# Patient Record
Sex: Male | Born: 1980 | Race: White | Hispanic: Yes | Marital: Married | Smoking: Current some day smoker
Health system: Southern US, Community
[De-identification: ages and names within clinical notes are randomized; demographics above are authoritative.]

## PROBLEM LIST (undated history)

## (undated) ENCOUNTER — Emergency Department (HOSPITAL_COMMUNITY): Payer: Self-pay | Source: Home / Self Care

## (undated) DIAGNOSIS — I10 Essential (primary) hypertension: Secondary | ICD-10-CM

## (undated) HISTORY — DX: Essential (primary) hypertension: I10

---

## 2004-12-15 ENCOUNTER — Ambulatory Visit (HOSPITAL_COMMUNITY): Admission: RE | Admit: 2004-12-15 | Discharge: 2004-12-15 | Payer: Self-pay | Admitting: *Deleted

## 2006-04-08 ENCOUNTER — Encounter: Admission: RE | Admit: 2006-04-08 | Discharge: 2006-04-08 | Payer: Self-pay | Admitting: Neurology

## 2011-03-03 ENCOUNTER — Ambulatory Visit: Payer: Self-pay | Admitting: Family Medicine

## 2011-03-03 ENCOUNTER — Encounter: Payer: Self-pay | Admitting: Family Medicine

## 2011-03-03 ENCOUNTER — Ambulatory Visit: Payer: Self-pay

## 2011-03-03 VITALS — BP 131/87 | HR 72 | Temp 98.1°F | Resp 16 | Ht 63.25 in | Wt 185.1 lb

## 2011-03-03 DIAGNOSIS — M25572 Pain in left ankle and joints of left foot: Secondary | ICD-10-CM

## 2011-03-03 DIAGNOSIS — M25579 Pain in unspecified ankle and joints of unspecified foot: Secondary | ICD-10-CM

## 2011-03-03 NOTE — Progress Notes (Signed)
  Subjective:    Patient ID: Dakota Wilkinson, male    DOB: 10/21/1980, 31 y.o.   MRN: [id number]  HPI 31 yo male with ankle pain for 2 months. Twisted left ankle at work, fell.  Hasn't seen anyone.  Swelled and bruised.  Able to walk on it. Still having pain, mostly just when gets cold.  Not when it is warm.  No pain at work.  Slight swelling still.  Not taking any meds.     Review of Systems Negative except as per HPI     Objective:   Physical Exam  Constitutional: He appears well-developed and well-nourished.  Pulmonary/Chest: Effort normal.  Musculoskeletal:       Left ankle: He exhibits swelling. He exhibits normal range of motion, no ecchymosis, no deformity and normal pulse. no tenderness.   Very subtle swelling around left lateral malleolus.  Otherwise normal ankle.   [facility] Primary radiology reading by Dr. Georgiana Shore: Normal.  No new or old fracture seen.      Assessment & Plan:  Ankle pain - only "when cold".  Normal exam.  ? Post injury inflammation/arthritis.  Aleve prn.  Has ankle sleeve brace at home.  RTC if worsens or changes.

## 2012-10-07 ENCOUNTER — Ambulatory Visit: Payer: Self-pay | Admitting: Family Medicine

## 2012-10-07 VITALS — BP 126/78 | HR 72 | Temp 97.6°F | Resp 16 | Ht 63.5 in | Wt 166.0 lb

## 2012-10-07 DIAGNOSIS — R209 Unspecified disturbances of skin sensation: Secondary | ICD-10-CM

## 2012-10-07 DIAGNOSIS — M791 Myalgia, unspecified site: Secondary | ICD-10-CM

## 2012-10-07 DIAGNOSIS — E119 Type 2 diabetes mellitus without complications: Secondary | ICD-10-CM

## 2012-10-07 DIAGNOSIS — R202 Paresthesia of skin: Secondary | ICD-10-CM

## 2012-10-07 DIAGNOSIS — IMO0001 Reserved for inherently not codable concepts without codable children: Secondary | ICD-10-CM

## 2012-10-07 DIAGNOSIS — R42 Dizziness and giddiness: Secondary | ICD-10-CM

## 2012-10-07 DIAGNOSIS — I10 Essential (primary) hypertension: Secondary | ICD-10-CM

## 2012-10-07 DIAGNOSIS — R079 Chest pain, unspecified: Secondary | ICD-10-CM

## 2012-10-07 LAB — POCT CBC
Granulocyte percent: 48.3 %G (ref 37–80)
Hemoglobin: 14.3 g/dL (ref 14.1–18.1)
Lymph, poc: 2.8 (ref 0.6–3.4)
MCH, POC: 31.4 pg — AB (ref 27–31.2)
MCHC: 33.1 g/dL (ref 31.8–35.4)
MCV: 95 fL (ref 80–97)
MPV: 8.4 fL (ref 0–99.8)
POC Granulocyte: 2.9 (ref 2–6.9)
POC LYMPH PERCENT: 46.2 %L (ref 10–50)
POC MID %: 5.5 %M (ref 0–12)
RBC: 4.55 M/uL — AB (ref 4.69–6.13)
RDW, POC: 12.7 %
WBC: 6 10*3/uL (ref 4.6–10.2)

## 2012-10-07 LAB — COMPREHENSIVE METABOLIC PANEL
ALT: 35 U/L (ref 0–53)
AST: 21 U/L (ref 0–37)
Albumin: 4.9 g/dL (ref 3.5–5.2)
Alkaline Phosphatase: 71 U/L (ref 39–117)
BUN: 13 mg/dL (ref 6–23)
CO2: 28 mEq/L (ref 19–32)
Calcium: 9.6 mg/dL (ref 8.4–10.5)
Chloride: 100 mEq/L (ref 96–112)
Creat: 0.8 mg/dL (ref 0.50–1.35)
Glucose, Bld: 86 mg/dL (ref 70–99)
Potassium: 4 mEq/L (ref 3.5–5.3)
Sodium: 137 mEq/L (ref 135–145)
Total Bilirubin: 0.9 mg/dL (ref 0.3–1.2)

## 2012-10-07 LAB — POCT GLYCOSYLATED HEMOGLOBIN (HGB A1C): Hemoglobin A1C: 5.2

## 2012-10-07 LAB — CK: Total CK: 174 U/L (ref 7–232)

## 2012-10-07 LAB — GLUCOSE, POCT (MANUAL RESULT ENTRY): POC Glucose: 86 mg/dl (ref 70–99)

## 2012-10-07 NOTE — Addendum Note (Signed)
Addended by: Meredith Staggers R on: 10/07/2012 11:38 AM   Modules accepted: Orders, Medications

## 2012-10-07 NOTE — Progress Notes (Addendum)
Subjective:    Patient ID: Dakota Wilkinson, male    DOB: 04/13/1980, 32 y.o.   MRN: [id number]  HPI Dakota Wilkinson is a 32 y.o. male PCP: No primary provider on file. Hx of DM2 (on Amaryl 1mg  QD), and HTN (Zestoretic 10/12.5mg  qd) with multiple areas of concern today. Last seen here in February 2013.  Has been seen at [facility] clinic 3-4 months ago - had elevated sugar at 198. Started on amaryl 1mg  qd - no eval since that time. States he had slight elevation in liver tests then, but no medicines needed.   3-4 month hx of episodic pains in arms and legs, hands, feet. Comes and goes. Occasional pins and needle feeling in fingers and toes. No abdominal pain. No rash.  Does not check home blood sugar. Denies increased thirst, drinks 4 bottles of water per day. Tx: none.   also notes occasional dizziness with itching feeling in head. 2-3 times per week. admits to occasional chest pain - few times per week, but no palpitations and not at same time as dizziness.   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   FH: DM - dad, sister, grandparents.  No known fh of cardiac dz.   Pharmacy - [facility] - [address].  Spanish speaking only. Spanish spoken. Understanding expressed.   Review of Systems  Respiratory: Negative for cough and shortness of breath.   Cardiovascular: Positive for chest pain. Negative for palpitations.  Gastrointestinal: Negative for abdominal pain.  Musculoskeletal: Positive for myalgias and arthralgias.  Skin: Negative for rash.  Neurological: Positive for dizziness, numbness (toes and fingers at times. ) and headaches. Negative for syncope, facial asymmetry and weakness.  otherwise per hpi.     Objective:   Physical Exam  Constitutional: He is oriented to person, place, and time. He appears well-developed and well-nourished.  HENT:  Head: Normocephalic and atraumatic.  Eyes: Pupils are equal, round, and reactive to light.  Cardiovascular: Normal rate,  regular rhythm, normal heart sounds and intact distal pulses.   Pulmonary/Chest: Effort normal and breath sounds normal.  Abdominal: Soft. There is no tenderness.  Musculoskeletal: He exhibits no edema and no tenderness.  Neurological: He is alert and oriented to person, place, and time. He has normal strength. He displays no atrophy and no tremor. No cranial nerve deficit or sensory deficit. He exhibits normal muscle tone. He displays a negative Romberg sign. Coordination and gait normal. GCS eye subscore is 4. GCS verbal subscore is 5. GCS motor subscore is 6.  Nonfocal, no pronator drift. Sensate to ft's, cap refill less than 1 second.   Skin: Skin is warm, dry and intact. No rash noted.  Psychiatric: He has a normal mood and affect. His behavior is normal.   EKG: SR, early repol, no acute findings otherwise.   Results for orders placed in visit on 10/07/12  POCT CBC      Result Value Range   WBC 6.0  4.6 - 10.2 K/uL   Lymph, poc 2.8  0.6 - 3.4   POC LYMPH PERCENT 46.2  10 - 50 %L   MID (cbc) 0.3  0 - 0.9   POC MID % 5.5  0 - 12 %M   POC Granulocyte 2.9  2 - 6.9   Granulocyte percent 48.3  37 - 80 %G   RBC 4.55 (*) 4.69 - 6.13 M/uL   Hemoglobin 14.3  14.1 - 18.1 g/dL   HCT, POC 40.9 (*) 81.1 - 53.7 %  MCV 95.0  80 - 97 fL   MCH, POC 31.4 (*) 27 - 31.2 pg   MCHC 33.1  31.8 - 35.4 g/dL   RDW, POC 78.2     Platelet Count, POC 251  142 - 424 K/uL   MPV 8.4  0 - 99.8 fL  GLUCOSE, POCT (MANUAL RESULT ENTRY)      Result Value Range   POC Glucose 86  70 - 99 mg/dl  POCT GLYCOSYLATED HEMOGLOBIN (HGB A1C)      Result Value Range   Hemoglobin A1C 5.2         Assessment & Plan:  Dakota Wilkinson is a 32 y.o. male Dizziness - episodic. Reassuring EKG, may be having symptomatic hypoglycemia as on Amaryl and A1c of 5.2.  Stop Amaryl, check blood sugars out of office and recheck next few weeks with readings. Increase fluids during day and when working outside. CMP and TSH pending.  RTC/ER precautions.   Myalgia - episodic, diffuse.  nonfocal exam. 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 Check CPK, tylenol if needed, recheck next few weeks.   DM II (diabetes mellitus, type II), controlled - based on single outside reading of 189, but based on A1c, possible hypoglycemia. Stop amaryl, otc glucometer, and recheck in next 2 weeks with readings.   HTN (hypertension) - Plan: Comprehensive metabolic panel - stable today.   Chest pain - Plan: CK, EKG 12-Lead, EKG 12-Lead reassuring. Episodic. If recurs - rtc or ER for further eval, but also planning on follow up in 2 weeks. msk source/myalgias as in extremities possible contributor. Er/911 CP precautions reviewed.   Paresthesias - check TSH, stop Amaryl., increase fluids during day. Rtc/er precautions. CMP and TSH pending. Plan to recheck in next 2 weeks.   Language barrier - spanish spoken, understanding expressed. Spanish speaking assistant typed instructions for pt.   Patient Instructions  Deja de tomar la medicina de la diabetes compra un medidor de Chief Financial Officer y Chief Strategy Officer por lo menos una ves al dia, apuntalos y traeme el record cuando regreses [employer]. incrementa los liquidos durante el dia especialmete cuando trabajes afuera Tylenol si necesitas para musculos adoloridos regesas a chequeo en dos semanas  si el dolor en el pecho regresa o empeora vas a cuarto de emergencia o regresas aqui.

## 2012-10-07 NOTE — Patient Instructions (Signed)
Deja de tomar la medicina de la diabetes compra un medidor de Chief Financial Officer y Chief Strategy Officer por lo menos una ves al dia, apuntalos y traeme el record cuando regreses [employer]. incrementa los liquidos durante el dia especialmete cuando trabajes afuera Tylenol si necesitas para musculos adoloridos regesas a chequeo en dos semanas  si el dolor en el pecho regresa o empeora vas a cuarto de emergencia o regresas aqui.

## 2012-11-06 ENCOUNTER — Ambulatory Visit: Payer: Self-pay | Admitting: Family Medicine

## 2012-11-06 VITALS — BP 130/80 | HR 70 | Temp 98.1°F | Resp 16 | Ht 63.5 in | Wt 169.0 lb

## 2012-11-06 DIAGNOSIS — I10 Essential (primary) hypertension: Secondary | ICD-10-CM | POA: Insufficient documentation

## 2012-11-06 DIAGNOSIS — E119 Type 2 diabetes mellitus without complications: Secondary | ICD-10-CM | POA: Insufficient documentation

## 2012-11-06 MED ORDER — LISINOPRIL-HYDROCHLOROTHIAZIDE 10-12.5 MG PO TABS: 1.0000 | ORAL_TABLET | Freq: Every day | ORAL | Status: AC

## 2012-11-06 NOTE — Progress Notes (Signed)
[facility] [address], [place] [address] [phone]  Date:  11/06/2012   Name:  Dakota Wilkinson   DOB:  1980/05/26   MRN:  [id number]  PCP:  No PCP Per Patient    Chief Complaint: Follow-up   History of Present Illness:  Dakota Wilkinson is a 32 y.o. very pleasant male patient who presents with the following:  Here today to recheck DM and HTN.  He was seen about one month ago with possible hypoglycemia.  His amaryl was stopped with plan for recheck in a few weeks. He was instructed to check his FBS every morning.  He feels fine today. He had felt dizzy when he was taking the amaryl He brings with him a list of FBG.  Running 102- 174; most are under 150.  CMP in September- looked good  He does exercise once or twice a week at the gym Work in [employer] representative, non- smoker  There are no active problems to display for this patient.   Past Medical History  Diagnosis Date  . Hypertension     History reviewed. No pertinent past surgical history.  History  Substance Use Topics  . Smoking status: Never Smoker   . Smokeless tobacco: Not on file  . Alcohol Use: Not on file    Family History  Problem Relation Age of Onset  . Diabetes Father   . Diabetes Sister     No Known Allergies  Medication list has been reviewed and updated.  Current Outpatient Prescriptions on File Prior to Visit  Medication Sig Dispense Refill  . lisinopril-hydrochlorothiazide (PRINZIDE,ZESTORETIC) 10-12.5 MG per tablet Take 1 tablet by mouth daily.       No current facility-administered medications on file prior to visit.    Review of Systems:  As per HPI- otherwise negative.   aPhysical Examination: Filed Vitals:   11/06/12 0809  BP: 130/80  Pulse: 70  Temp: 98.1 F (36.7 C)  Resp: 16   Filed Vitals:   11/06/12 0809  Height: 5' 3.5" (1.613 m)  Weight: 169 lb (76.658 kg)   Body mass index is 29.46 kg/(m^2). Ideal Body Weight: Weight in (lb) to  have BMI = 25: 143.1  GEN: WDWN, NAD, Non-toxic, A & O x 3, mild overweight/ stocky build HEENT: Atraumatic, Normocephalic. Neck supple. No masses, No LAD. Ears and Nose: No external deformity. CV: RRR, No M/G/R. No JVD. No thrill. No extra heart sounds. PULM: CTA B, no wheezes, crackles, rhonchi. No retractions. No resp. distress. No accessory muscle use. EXTR: No c/c/e NEURO Normal gait.  PSYCH: Normally interactive. Conversant. Not depressed or anxious appearing.  Calm demeanor.   Results for orders placed in visit on 11/06/12  POCT GLYCOSYLATED HEMOGLOBIN (HGB A1C)      Result Value Range   Hemoglobin A1C 5.3       Assessment and Plan: Type II or unspecified type diabetes mellitus without mention of complication, not stated as uncontrolled - Plan: POCT glycosylated hemoglobin (Hb A1C)  HTN (hypertension) - Plan: lisinopril-hydrochlorothiazide (PRINZIDE,ZESTORETIC) 10-12.5 MG per tablet  At this time his A1c looks good, we will continue to manage his DM with diet.  Encouraged more exercise (more days of the week) and discussed diet.  He will continue to check his glucose a few times a week, and plan recheck in 3 months.  Continue BP medication Encouraged flu shot at pharmacy for cost savings.  HTN is controlled  Signed Abbe Amsterdam, MD

## 2012-11-06 NOTE — Patient Instructions (Signed)
Que tiene una vacuana por el flu en la pharmacia.   seguir de Acupuncturist.   regresa a Event organiser en 3 meses

## 2012-12-03 ENCOUNTER — Ambulatory Visit: Payer: Self-pay | Admitting: Emergency Medicine

## 2012-12-03 VITALS — BP 116/68 | HR 80 | Temp 99.1°F | Resp 16 | Ht 64.0 in | Wt 174.0 lb

## 2012-12-03 DIAGNOSIS — K5900 Constipation, unspecified: Secondary | ICD-10-CM

## 2012-12-03 MED ORDER — POLYETHYLENE GLYCOL 3350 17 GM/SCOOP PO POWD: 17.0000 g | Freq: Every day | ORAL | Status: AC

## 2012-12-03 NOTE — Patient Instructions (Signed)
Constipación - Adulto   (Constipation, Adult)   Constipación significa que una persona tiene menos de 3 evacuaciones en una semana, hay dificultad para evacuar el intestino, o las heces son secas, duras, o más grandes que lo normal. A medida que envejecemos el estreñimiento es más común. Si intenta curar el estreñimiento con medicamentos que producen la evacuación intestinal (laxantes), el problema puede empeorar. El uso prolongado de laxantes puede hacer que los músculos del colon se debiliten. Una dieta baja en fibra, no tomar suficientes líquidos y el uso de ciertos medicamentos pueden empeorar el estreñimiento.   CAUSAS   · Ciertos medicamentos, como los antidepresivos, analgésicos, suplementos de hierro, antiácidos y diuréticos.    · Algunas enfermedades, como la diabetes, el síndrome del colon irritable (SII), enfermedad de la tiroides, o depresión.    · No beber suficiente agua.    · No consumir suficientes alimentos ricos en fibra.    · Situaciones de estrés o viajes.  · Falta de actividad física o de ejercicio.  · No ir al baño cuando siente la necesidad.  · Ignorar la necesidad súbita de mover el intestino.  · Uso en exceso de laxantes.  SÍNTOMAS   · Evacuar el intestino menos de 3 veces a la semana.    · Dificultad para mover el intestino    · Tener las heces secas y duras, o más grandes que las normales.    · Sensación de estar lleno o distendido.    · Dolor en la parte baja del abdomen  · No se siente alivio después de evacuar el intestino.  DIAGNÓSTICO   El médico le hará una historia clínica y le hará un examen físico. Pueden hacerle exámenes adicionales para el estreñimiento grave. Algunas pruebas son:   · Un radiografía con enema de bario para examinar el recto, el colon y en algunos casos el intestino delgado.  · Una sigmoidoscopia para examinar el colon inferior.  · Una colonoscopia para examinar todo el colon.  TRATAMIENTO   El tratamiento dependerá de la gravedad de la constipación y de la  causa. Algunos tratamientos dietéticos son beber más líquidos y comer más alimentos ricos en fibra. El cambio en el estilo de vida incluye hacer ejercicios de manera regular. Si estas recomendaciones para realizar cambios en la dieta y en el estilo de vida no ayudan, el médico le puede indicar el uso de laxantes de venta libre para favorecer el movimiento intestinal. Los medicamentos con receta se pueden prescribir si los medicamentos de venta libre no lo mejoran.   INSTRUCCIONES PARA EL CUIDADO EN EL HOGAR   · Aumente el consumo de alimentos con fibra, como frutas, verduras, granos enteros y frijoles. Limite los azúcares ricos en grasas y procesados   en su dieta, tales como papas fritas, hamburguesas, galletas, dulces y refrescos.    · Puede agregar un suplemento de fibra a su dieta si no obtiene lo suficiente de los alimentos.    · Debe ingerir gran cantidad de líquido para mantener la orina de tono claro o color amarillo pálido.    · Haga ejercicios regularmente o según las indicaciones de su médico.    · Vaya al baño cuando sienta la necesidad de ir. No espere.  · Tome sólo la medicación que le indicó el profesional.  No tome otros medicamentos para la constipación sin consultar a su médico.  SOLICITE ATENCIÓN MÉDICA DE INMEDIATO SI:   · Observa sangre brillante en las heces.    · La constipación dura más de 4 días o empeora.    · Siente   dolor abdominal o rectal.    · Las heces son delgadas como un lápiz.  · Pierde peso de manera inexplicable.  ASEGÚRESE DE QUE:   · Comprende estas instrucciones.  · Controlará su enfermedad.  · Solicitará ayuda de inmediato si no mejora o empeora.  Document Released: 01/24/2007 Document Revised: 07/06/2011  [employer]® Patient Information ©2014 [employer].

## 2012-12-03 NOTE — Progress Notes (Signed)
[facility] [address] [place] [address] [phone]  Date:  12/03/2012   Name:  Dakota Wilkinson   DOB:  [birth year]   MRN:  [id number]  PCP:  No PCP Per Patient    Chief Complaint: Follow-up   History of Present Illness:  EPIC TRIBBETT is a 32 y.o. very pleasant male patient who presents with the following:  Comes for recheck.  Concerned about pigmented areas on his lower lip and face.  Has noticed them over past month.  Says sugars are running around 120 fasting.  No polyuria or polydipsia.  Weight stable.  Describes left abdominal pain and gas over long term.  Frequently constipated.  No nausea or vomiting.  No blood  In stool.   Patient Active Problem List   Diagnosis Date Noted  . Type II or unspecified type diabetes mellitus without mention of complication, not stated as uncontrolled 11/06/2012  . HTN (hypertension) 11/06/2012    Past Medical History  Diagnosis Date  . Hypertension     No past surgical history on file.  History  Substance Use Topics  . Smoking status: Never Smoker   . Smokeless tobacco: Not on file  . Alcohol Use: Not on file    Family History  Problem Relation Age of Onset  . Diabetes Father   . Diabetes Sister     No Known Allergies  Medication list has been reviewed and updated.  Current Outpatient Prescriptions on File Prior to Visit  Medication Sig Dispense Refill  . lisinopril-hydrochlorothiazide (PRINZIDE,ZESTORETIC) 10-12.5 MG per tablet Take 1 tablet by mouth daily.  90 tablet  3   No current facility-administered medications on file prior to visit.    Review of Systems:  As per HPI, otherwise negative.    Physical Examination: Filed Vitals:   12/03/12 0813  BP: 116/68  Pulse: 80  Temp: 99.1 F (37.3 C)  Resp: 16   Filed Vitals:   12/03/12 0813  Height: 5\' 4"  (1.626 m)  Weight: 174 lb (78.926 kg)   Body mass index is 29.85 kg/(m^2). Ideal Body Weight: Weight in (lb) to have  BMI = 25: 145.3  GEN: WDWN, NAD, Non-toxic, A & O x 3 HEENT: Atraumatic, Normocephalic. Neck supple. No masses, No LAD. Ears and Nose: No external deformity. CV: RRR, No M/G/R. No JVD. No thrill. No extra heart sounds. PULM: CTA B, no wheezes, crackles, rhonchi. No retractions. No resp. distress. No accessory muscle use. ABD: S, NT, ND, +BS. No rebound. No HSM. EXTR: No c/c/e NEURO Normal gait.  PSYCH: Normally interactive. Conversant. Not depressed or anxious appearing.  Calm demeanor.  Has numerous flat hyperpigmented single color regular border several millimeter lesions on his lower lip and face.  Assessment and Plan: Constipation NIDDM diet controlled. miralax  Signed,  Phillips Odor, MD

## 2014-07-29 ENCOUNTER — Ambulatory Visit (INDEPENDENT_AMBULATORY_CARE_PROVIDER_SITE_OTHER): Payer: Self-pay | Admitting: Emergency Medicine

## 2014-07-29 VITALS — BP 144/102 | HR 86 | Temp 98.1°F | Resp 16 | Ht 64.0 in | Wt 187.8 lb

## 2014-07-29 DIAGNOSIS — IMO0002 Reserved for concepts with insufficient information to code with codable children: Secondary | ICD-10-CM

## 2014-07-29 DIAGNOSIS — R21 Rash and other nonspecific skin eruption: Secondary | ICD-10-CM

## 2014-07-29 DIAGNOSIS — E119 Type 2 diabetes mellitus without complications: Secondary | ICD-10-CM

## 2014-07-29 DIAGNOSIS — K645 Perianal venous thrombosis: Secondary | ICD-10-CM

## 2014-07-29 LAB — POCT SKIN KOH: Skin KOH, POC: POSITIVE

## 2014-07-29 LAB — POCT GLYCOSYLATED HEMOGLOBIN (HGB A1C): Hemoglobin A1C: 7.6

## 2014-07-29 LAB — GLUCOSE, POCT (MANUAL RESULT ENTRY): POC Glucose: 189 mg/dl — AB (ref 70–99)

## 2014-07-29 MED ORDER — HYDROCORTISONE 1 % EX CREA: TOPICAL_CREAM | CUTANEOUS | Status: DC

## 2014-07-29 MED ORDER — METFORMIN HCL 500 MG PO TABS: ORAL_TABLET | ORAL | Status: AC

## 2014-07-29 MED ORDER — MICONAZOLE NITRATE 2 % EX CREA: TOPICAL_CREAM | CUTANEOUS | Status: AC

## 2014-07-29 NOTE — Progress Notes (Addendum)
   Subjective:  This chart was scribed for Earl LitesSteve Lilyth Lawyer, MD by [facility] FacilityNadim Abu Hashem, medical scribe at [facility].The patient was seen in exam room 02 and the patient's care was started at 9:26 AM.   Patient ID: Everrett CoombeJulian G Skates, male    DOB: [birth year], 34 y.o.   MRN: [id number] Chief Complaint  Patient presents with  . Hemorrhoids    2-3 days  . Rash    red itchy bumps on penis, 3-4 days  HPI HPI Comments: Everrett CoombeJulian G Blodgett is a 34 y.o. male who [facility] complaining of hemorrhoids onset three days ago with associated swelling. He does have a history of hemorrhoids.  Pt also complains of a genital rash onset three days ago. The area is itchy.  He is a Insurance claims handlerspanish speaker but no translator was needed.  Review of Systems  Gastrointestinal: Positive for anal bleeding.  Skin: Positive for rash.     Objective:  BP 144/102 mmHg  Pulse 86  Temp(Src) 98.1 F (36.7 C) (Oral)  Resp 16  Ht 5\' 4"  (1.626 m)  Wt 187 lb 12.8 oz (85.186 kg)  BMI 32.22 kg/m2  SpO2 98% Physical Exam  Vitals reviewed. CONSTITUTIONAL: Well developed/well nourished HEAD: Normocephalic/atraumatic EYES: EOMI/PERRL ENMT: Mucous membranes moist NECK: supple no meningeal signs SPINE/BACK:entire spine nontender CV: S1/S2 noted, no murmurs/rubs/gallops noted LUNGS: Lungs are clear to auscultation bilaterally, no apparent distress ABDOMEN: soft, nontender, no rebound or guarding, bowel sounds noted throughout abdomen GU:no cva tenderness, uncircumcised male, skin breakdown of the foreskin with a white discharge there is mild irritation of the urethral meatus. At 3 o'clock 1.5 x 1.5 cm thrombotic hemorrhoid  NEURO: Pt is awake/alert/appropriate, moves all extremitiesx4.  No facial droop.   EXTREMITIES: pulses normal/equal, full ROM SKIN: warm, color normal PSYCH: no abnormalities of mood noted, alert and oriented to situation Results for orders placed or performed in  visit on 07/29/14  POCT glucose (manual entry)  Result Value Ref Range   POC Glucose 189 (A) 70 - 99 mg/dl  POCT Skin KOH  Result Value Ref Range   Skin KOH, POC Positive    Results for orders placed or performed in visit on 07/29/14  POCT glucose (manual entry)  Result Value Ref Range   POC Glucose 189 (A) 70 - 99 mg/dl  POCT Skin KOH  Result Value Ref Range   Skin KOH, POC Positive   POCT glycosylated hemoglobin (Hb A1C)  Result Value Ref Range   Hemoglobin A1C 7.6   PROCEDURE NOTE   the perianal area was prepped with Betadine the thrombosed hemorrhoid was numbed with 2 mL of 2% plain. A #11 blade was used to open the area. The clot was extracted without difficulty patient tolerated the procedure well Assessment & Plan:   Patient also is now diabetic. His A1c is 7.6. Will start Glucophage 1 a day for the first week and then 1 twice a day his hemorrhoid was lanced and he was started on cream for his yeast balanitis. We'll recheck in 3 months.I personally performed the services described in this documentation, which was scribed in my presence. The recorded information has been reviewed and is accurate.  Earl LitesSteve Zuriel Roskos, MD

## 2014-07-29 NOTE — Patient Instructions (Signed)
Balanitis (Balanitis) La balanitis es la inflamacin de la cabeza del pene (glande).  CAUSAS  Puede tener mltiples causas, tanto infecciosas como no infecciosas. Con frecuencia, la balanitis es el resultado de una higiene personal deficiente, especialmente en los hombres no circuncidados. Sin una higiene Betsy Layneadecuada, los virus, las bacterias y los hongos se acumulan entre el prepucio y el glande. Esto puede ocasionar una infeccin. La falta de aire y la irritacin debido a la secrecin normal llamada esmegma contribuyen al problema en los hombres no circuncidados. Otras causas son:  Irritacin qumica por el uso de algunos jabones y geles de ducha (especialmente jabones con perfume), condones, lubricantes personales, vaselina, espermicidas y acondicionadores de telas.  Enfermedades de la piel, como el eczema, la dermatitis y la psoriasis.  Alergias a algunos frmacos, como tetraciclina y sulfas.  Algunas enfermedades como la cirrosis heptica, insuficiencia cardaca congestiva y enfermedades renales.  Obesidad mrbida. FACTORES DE RIESGO  Diabetes mellitus.  Un prepucio apretado que es difcil de tirar hacia atrs hasta pasar el glande (fimosis).  Tener relaciones sexuales sin usar un condn. Blake DivineSIGNOS Y SNTOMAS  Los sntomas pueden ser:  Secrecin que proviene de la zona debajo del prepucio.  Sensibilidad.  Picazn e imposibilidad para Landscape architectmantener una ereccin (debido al dolor).  Irritacin y erupcin cutnea.  Llagas en el glande y el prepucio. DIAGNSTICO El diagnstico de balanitis se realiza a travs de un examen fsico. TRATAMIENTO El tratamiento se basa en la causa. El tratamiento puede incluir:  Lavados frecuentes.  Mantener el glande y el prepucio secos.  El uso de medicamentos, como cremas, Clinical research associateanalgsicos, antibiticos o medicamentos para tratar infecciones por hongos.  Baos de asiento. Si la irritacin est originada en una cicatriz del prepucio que impide una  retraccin fcil, se recomienda una circuncisin.  INSTRUCCIONES PARA EL CUIDADO EN EL HOGAR  Debe evitar las relaciones sexuales hasta que la afeccin se haya mejorado. ASEGRESE DE QUE:  Comprende estas instrucciones.  Controlar su afeccin.  Recibir ayuda de inmediato si no mejora o si empeora. Document Released: 09/06/2012 Document Revised: 01/09/2013 [facility] Patient Information 2015 [place], [place]. This information is not intended to replace advice given to you by your health care provider. Make sure you discuss any questions you have with your health care provider. Diabetes mellitus tipo2 (Type 2 Diabetes Mellitus) La diabetes mellitus tipo2, generalmente denominada diabetes tipo2, es una enfermedad prolongada (crnica). En la diabetes tipo2, el pncreas no produce suficiente insulina (una hormona), las clulas son menos sensibles a la insulina que se produce (resistencia a la insulina), o ambos. Normalmente, la Johnson Controlsinsulina mueve los azcares de los alimentos a las clulas de los tejidos. Las clulas de los tejidos Cendant Corporationutilizan los azcares para Psychiatristobtener energa. La falta de insulina o la falta de una respuesta normal a la insulina hace que el exceso de azcar se acumule en la sangre en lugar de Customer service managerpenetrar en las clulas de los tejidos. Como resultado, se producen niveles altos de Bankerazcar en la sangre (hiperglucemia). El [address] Veterans Drefecto de los niveles altos de azcar (glucosa) puede causar muchas complicaciones.  La diabetes tipo2 antes tambin se denominaba diabetes del Ainsworthadulto, pero puede ocurrir a Actuarycualquier edad.  FACTORES DE RIESGO  Una persona tiene mayor predisposicin a desarrollar diabetes tipo 2 si alguien en su familia tiene la enfermedad y tambin tiene uno o ms de los siguientes factores de riesgo principales:  Sobrepeso.  Estilo de vida sedentario.  Una historia de consumo constante de alimentos de altas caloras. Mantener un peso saludable  y realizar actividad fsica regular puede  reducir la probabilidad de desarrollar diabetes tipo 2. SNTOMAS  Una persona con diabetes tipo 2 no presenta sntomas en un principio. Los sntomas de la diabetes tipo 2 aparecen lentamente. Los sntomas son:  Aumento de la sed (polidipsia).  Aumento de la miccin (poliuria).  Orina con ms frecuencia durante la noche (nocturia).  Prdida de peso. La prdida de peso puede ser muy rpida.  Infecciones frecuentes y recurrentes.  Cansancio (fatiga).  Debilidad.  Cambios en la visin, como visin borrosa.  Olor a Water quality scientist.  Dolor abdominal.  Nuseas o vmitos.  Cortes o moretones que tardan en sanarse.  Hormigueo o adormecimiento de las manos y los pies. DIAGNSTICO Con frecuencia la diabetes tipo 2 no se diagnostica hasta que se presentan las complicaciones de la diabetes. La diabetes tipo 2 se diagnostica cuando los sntomas o las complicaciones se presentan y cuando aumentan los niveles de glucosa en la [place]. El nivel de glucosa en la sangre puede controlarse en uno o ms de los siguientes anlisis de sangre:  Medicin de glucosa en la sangre en [place]. No se le permitir comer durante al menos 8 horas antes de que se tome [place] de [place].  Pruebas al azar de glucosa en la sangre. El nivel de glucosa en la sangre se controla en cualquier momento del da sin importar el momento en que haya comido.  Prueba de A1c (hemoglobina glucosilada) Una prueba de A1c proporciona informacin sobre el control de la glucosa en la sangre durante los ltimos 3 meses.  Prueba de tolerancia a la glucosa oral (PTGO). La glucosa en la sangre se mide despus de no haber comido (ayunas) durante dos horas y despus de beber una bebida que contenga glucosa. TRATAMIENTO   Usted puede necesitar administrarse insulina o medicamentos para la diabetes todos los das para [employer] niveles de glucosa en la sangre en el rango deseado.  Si [place] insulina, tal vez necesite ajustar la  dosis segn los carbohidratos que haya consumido en cada comida o colacin. El objetivo del tratamiento es mantener el nivel de azcar en la sangre previo a comer (glucosa preprandial) entre 70 y /dl. INSTRUCCIONES PARA EL CUIDADO EN EL HOGAR   Controle su nivel de hemoglobina A1c dos veces al ao.  Contrlese a diario Air cabin crew de glucosa en la sangre segn las indicaciones de su mdico.  Supervise las cetonas en la orina cuando est enferma y segn las indicaciones de su mdico.  Tome el medicamento para la diabetes o adminstrese insulina segn las indicaciones de su mdico para Radio producer nivel de glucosa en la sangre en el rango deseado.  Nunca se quede sin medicamento para la diabetes o sin insulina. Es necesario que la reciba [employer].  Si [place] insulina, tal vez deba ajustar la cantidad de insulina administrada segn los carbohidratos consumidos. Los hidratos de carbono pueden aumentar los niveles de glucosa en la sangre, pero deben incluirse en su dieta. Los hidratos de carbono aportan vitaminas, minerales y [place] que son [place]omia Dear parte esencial de una dieta saludable. Los hidratos de carbono se encuentran en frutas, verduras, cereales integrales, productos lcteos, legumbres y alimentos que contienen azcares aadidos.  Consuma alimentos saludables. Programe una cita con un nutricionista certificado para que lo ayude a Chief Executive Officer de alimentacin adecuado para usted.  Baje de peso si es necesario.  Lleve una tarjeta de alerta mdica o use una pulsera o medalla de [place]  mdica.  Lleve con usted una colacin de 15gramos de hidratos de carbono en todo momento para controlar los niveles bajos de glucosa en la sangre (hipoglucemia). Algunos ejemplos de colaciones de 15gramos de hidratos de carbono son los siguientes:  Tabletas de glucosa, 3 o 4.  Gel de glucosa, tubo de 15 gramos.  Pasas de uva, 2 cucharadas (24 gramos).  Caramelos de goma, 6.  Galletas de  [place], 8.  Gaseosa comn, 4onzas ( ).  Pastillas de goma, 9.  Reconocer la hipoglucemia. La hipoglucemia se produce cuando los niveles de glucosa en la sangre son de 70 mg/dl o menos. El riesgo de hipoglucemia aumenta durante el ayuno o cuando se saltea las comidas, durante o despus de Education officer, environmental ejercicio intenso y Carbon duerme. Los sntomas de hipoglucemia son:  Temblores o sacudidas.  Disminucin de la capacidad de concentracin.  Sudoracin.  Aumento de la frecuencia cardaca.  Dolor de [place].  Sequedad en la boca.  Hambre.  Irritabilidad.  Ansiedad.  Sueo agitado.  Alteracin del habla o de la coordinacin.  Confusin.  Tratar la hipoglucemia rpidamente. Si usted est alerta y puede tragar con seguridad, siga la regla de 15/15 que consiste en:  [place] 15 y 20gramos de glucosa de accin rpida o carbohidratos. Las opciones de accin rpida son un gel de glucosa, tabletas de glucosa, o 4 onzas (120 ml) de jugo de frutas, gaseosa comn, o leche baja en grasa.  Compruebe su nivel de glucosa en la sangre 15 minutos despus de tomar la glucosa.  Tome entre 15 y 20 gramos ms de glucosa si el nivel de glucosa en la sangre todava es de 70mg /dl o inferior.  Ingiera una comida o una colacin en el lapso de 1 hora una vez que los niveles de glucosa en la sangre vuelven a la normalidad.  Est atento a si siente mucha sed u orina con mayor frecuencia, porque son signos tempranos de hiperglucemia. El reconocimiento temprano de la hiperglucemia permite un tratamiento oportuno. Trate la hiperglucemia segn le indic su mdico.  Haga, al menos, de actividad fsica moderada a la semana, distribuidos en, por lo menos, 3das a la semana o como lo indique su mdico. Adems, debe realizar ejercicios de resistencia por lo menos 2veces a la semana o como lo indique su mdico. Trate de no permanecer inactivo durante ms de  seguidos.  Ajuste su medicamento y la ingesta de alimentos, segn sea necesario, si inicia un nuevo ejercicio o deporte.  Siga su plan para los [address] de enfermedad cuando no puede comer o beber como de [place].  No consuma ningn producto que contenga tabaco, como cigarrillos, tabaco de Theatre manager o Administrator, Civil Service. Si necesita ayuda para dejar de fumar, hable con el mdico.  Limite el consumo de alcohol a no ms de 1 medida por da en las mujeres no embarazadas y 2 medidas en los hombres. Debe beber alcohol solo mientras come. Hable con su mdico acerca de si el alcohol es seguro para usted. Informe a su mdico si bebe alcohol varias veces a la semana.  Concurra a todas las visitas de control como se lo haya indicado el mdico. Esto es importante.  Programe un examen de la vista poco despus del diagnstico de diabetes tipo 2 y luego anualmente.  Realice diariamente el cuidado de la piel y de los pies. Examine su piel y los pies diariamente para ver si tiene cortes, moretones, enrojecimiento, problemas en las uas, sangrado, ampollas o Advertising account planner. Su mdico debe  hacerle un examen de los pies una vez por ao.  Cepllese los dientes y encas por lo menos dos veces al da y use hilo dental al menos una vez por da. Concurra regularmente a las visitas de control con el dentista.  Comparta su plan de control de diabetes en el trabajo o en la escuela.  Mantngase al da con las vacunas. Se recomienda que las Smith International de [id number] con diabetes se apliquen la vacuna contra la neumona. En algunos casos, pueden administrarse dos inyecciones separadas. Pregntele al mdico si tiene la vacuna contra la neumona al da.  Aprenda a Dealer.  Obtenga la mayor cantidad posible de informacin sobre la diabetes y solicite ayuda siempre que sea necesario.  Busque programas de rehabilitacin y participe en ellos para mantener o mejorar su independencia y su calidad de vida. Solicite la  derivacin a fisioterapia o terapia ocupacional si se le [employer] Health o la mano, o tiene problemas para asearse, vestirse, comer, o durante la [place] fsica. SOLICITE ATENCIN MDICA SI:   No puede comer alimentos o beber por ms de 6 horas.  Tuvo nuseas o ha vomitado durante ms de 6 horas.  Su nivel de glucosa en la sangre es mayor de 240 mg/dl.  Presenta algn cambio en el estado mental.  Desarrolla una enfermedad grave adicional.  Tuvo diarrea durante ms de 6 horas.  Ha estado enfermo o ha tenido fiebre durante un par [address] y no mejora.  Siente dolor al practicar cualquier actividad fsica. SOLICITE ATENCIN MDICA DE INMEDIATO SI:  Tiene dificultad para respirar.  Tiene niveles de cetonas moderados a altos. ASEGRESE DE QUE:  Comprende estas instrucciones.  Controlar su afeccin.  Recibir ayuda de inmediato si no mejora o si empeora. Document Released: 01/04/2005 Document Revised: 05/21/2013 [facility] Patient Information 2015 [place], [place]. This information is not intended to replace advice given to you by your health care provider. Make sure you discuss any questions you have with your health care provider.

## 2014-07-30 LAB — GC/CHLAMYDIA PROBE AMP
CT PROBE, AMP APTIMA: NEGATIVE
GC Probe RNA: NEGATIVE

## 2014-07-31 LAB — HERPES SIMPLEX VIRUS CULTURE: Organism ID, Bacteria: NOT DETECTED

## 2015-11-03 ENCOUNTER — Encounter (HOSPITAL_COMMUNITY): Payer: Self-pay | Admitting: *Deleted

## 2015-11-03 ENCOUNTER — Ambulatory Visit (INDEPENDENT_AMBULATORY_CARE_PROVIDER_SITE_OTHER): Payer: Self-pay

## 2015-11-03 ENCOUNTER — Ambulatory Visit (HOSPITAL_COMMUNITY)
Admission: EM | Admit: 2015-11-03 | Discharge: 2015-11-03 | Disposition: A | Discharge status: Home / Self Care | Payer: Self-pay | Attending: Emergency Medicine | Admitting: Emergency Medicine

## 2015-11-03 DIAGNOSIS — M79672 Pain in left foot: Secondary | ICD-10-CM

## 2015-11-03 DIAGNOSIS — M722 Plantar fascial fibromatosis: Secondary | ICD-10-CM

## 2015-11-03 MED ORDER — IBUPROFEN 800 MG PO TABS: 800.0000 mg | ORAL_TABLET | Freq: Three times a day (TID) | ORAL | 0 refills | Status: DC

## 2015-11-03 MED ORDER — TRAMADOL HCL 50 MG PO TABS: ORAL_TABLET | ORAL | 0 refills | Status: AC

## 2015-11-03 NOTE — ED Provider Notes (Signed)
HPI  SUBJECTIVE:  Dakota Wilkinson is a 35 y.o. male who presents with 2 weeks of constant, stabbing, plantar left foot pain. States it is located along the arch of his foot. It is not located anywhere else on his foot. No fevers, swelling, redness, trauma to the foot. No change in his physical activity. He works as a Corporate investment banker. No numbness, tingling, antipyretic in the past 6-8 hours. He has never had symptoms like this before. States that the first step in the morning is slightly painful. Symptoms are worse at the end of the day after he has been standing all day and with palpation. Symptoms are better with Tylenol. He has not tried anything else for this. He has a past medical history negative for diabetes, hypertension, previous history of injury to this foot. PMD: None.  History obtained using Gerilyn Nestle, CMA as Nurse, learning disability.  Past Medical History:  Diagnosis Date  . Hypertension     History reviewed. No pertinent surgical history.  Family History  Problem Relation Age of Onset  . Diabetes Father   . Diabetes Sister     Social History  Substance Use Topics  . Smoking status: Current Some Day Smoker    Years: 10.00    Types: Cigars  . Smokeless tobacco: Not on file  . Alcohol use Not on file    No current facility-administered medications for this encounter.   Current Outpatient Prescriptions:  .  hydrocortisone cream 1 %, Apply small amount to anal area twice a day, Disp: 30 g, Rfl: 0 .  ibuprofen (ADVIL,MOTRIN) 800 MG tablet, Take 1 tablet (800 mg total) by mouth 3 (three) times daily., Disp: 30 tablet, Rfl: 0 .  lisinopril-hydrochlorothiazide (PRINZIDE,ZESTORETIC) 10-12.5 MG per tablet, Take 1 tablet by mouth daily. (Patient not taking: Reported on 07/29/2014), Disp: 90 tablet, Rfl: 3 .  metFORMIN (GLUCOPHAGE) 500 MG tablet, Take 1 a day for the first week then 1 twice a day, Disp: 60 tablet, Rfl: 11 .  miconazole (MICATIN) 2 % cream, Apply small amount to the foreskin  twice a day, Disp: 28.35 g, Rfl: 0 .  polyethylene glycol powder (GLYCOLAX/MIRALAX) powder, Take 17 g by mouth daily. (Patient not taking: Reported on 07/29/2014), Disp: 3350 g, Rfl: 1 .  traMADol (ULTRAM) 50 MG tablet, 1-2 tabs po q 6 hr prn pain Maximum dose= 8 tablets per day, Disp: 20 tablet, Rfl: 0  No Known Allergies   ROS  As noted in HPI.   Physical Exam  BP 146/79 (BP Location: Left Arm)   Pulse 84   Temp 97.7 F (36.5 C) (Oral)   Resp 12   SpO2 100%   Constitutional: Well developed, well nourished, no acute distress Eyes:  EOMI, conjunctiva normal bilaterally HENT: Normocephalic, atraumatic,mucus membranes moist Respiratory: Normal inspiratory effort Cardiovascular: Normal rate GI: nondistended skin: No rash, skin intact Musculoskeletal:  Point tenderness plantar surface of the mid arch. There is no other tenderness along the plantar fascia. Normal appearance left foot. Left midfoot NT. Base of fifth metatarsal NT. No bruising. Skin intact. DP 2+. Refill less than 2 seconds. Sensation grossly intact. Patient able to move all toes actively.  Pain aggravated with toe extension.  Distal fibula NT, Medial malleolus NT,  Deltoid ligament NT, Lateral ligaments NT, Achilles NT. Patient able to bear weight while in department. Neurologic: Alert & oriented x 3, no focal neuro deficits Psychiatric: Speech and behavior appropriate   ED Course   Medications - No data to display  Orders Placed This Encounter  Procedures  . DG Foot Complete Left    Standing Status:   Standing    Number of Occurrences:   1    Order Specific Question:   Reason for Exam (SYMPTOM  OR DIAGNOSIS REQUIRED)    Answer:   r/o fx. pain plantar midfoot.  . Ambulatory referral to Podiatry    Referral Priority:   Urgent    Referral Type:   Consultation    Referral Reason:   Specialty Services Required    Requested Specialty:   Podiatry    Number of Visits Requested:   1    No results found for this  or any previous visit (from the past 24 hour(s)). Dg Foot Complete Left  Result Date: 11/03/2015 CLINICAL DATA:  Pain.  No known injury. EXAM: LEFT FOOT - COMPLETE 3+ VIEW COMPARISON:  No recent prior. FINDINGS: No acute bony or joint abnormality identified. No evidence of fracture or dislocation. IMPRESSION: No acute abnormality. Electronically Signed   By: Maisie Fushomas  Register   On: 11/03/2015 11:17    ED Clinical Impression  Plantar fasciitis of left foot  Foot pain, left   ED Assessment/Plan  Checking foot x-ray to rule out fracture given the point tenderness along the foot, otherwise if negative, we'll treat as plantar fasciitis. Home with ibuprofen, tramadol, heel cup, foot stretches, podiatry follow-up if no better in a week to 10 days. Also providing primary care referral as patient has no primary care physician.  Reviewed imaging independently. No fracture, dislocation. See radiology report for details. Linens above.  Using translator, Discussed imaging, MDM, plan and followup with patient. Answered all questions.. . Patient agrees with plan.   Meds ordered this encounter  Medications  . ibuprofen (ADVIL,MOTRIN) 800 MG tablet    Sig: Take 1 tablet (800 mg total) by mouth 3 (three) times daily.    Dispense:  30 tablet    Refill:  0  . traMADol (ULTRAM) 50 MG tablet    Sig: 1-2 tabs po q 6 hr prn pain Maximum dose= 8 tablets per day    Dispense:  20 tablet    Refill:  0    *This clinic note was created using Scientist, clinical (histocompatibility and immunogenetics)Dragon dictation software. Therefore, there may be occasional mistakes despite careful proofreading.  ?   Domenick GongAshley Latori Beggs, MD 11/03/15 1135

## 2015-11-03 NOTE — Discharge Instructions (Signed)
Heel cup, Arch support, stretching particularly at night. Follow-up with a podiatrist of your choice if no better in a week to 10 days. Follow-up with the primary care physician of your choice for routine health care. See list below  Below is a list of primary care practices who are taking new patients for you to follow-up with.   [facility] [phone] [address] [place] [address]  [facility]: [address] [place] [id number]  [phone]  [facility]: [address] [place] [id number]   [phone]  [facility]: [address] [place] [address]  [phone]  [facility] : [address] [place] [id number] [phone]  [facility]: [address] [place] [phone]  [facility]: [address] [place] [address] [phone]  Dr. Oneal GroutMahima Pandey [address] [facility] [address] [facility] [place] [id number]  [phone]  Dr. Jackie PlumGeorge Osei-Bonsu, [facility]. [address] [place] [address]  [phone]

## 2015-11-03 NOTE — ED Triage Notes (Signed)
Pt     Reports      2   Weeks      Ago        -    He  Developed  Pain  l  Foot      denys  Any specefic  Injury  He  Is  Awake  And  Alert  As  Well  As  Oriented         He  Reports  Pain on  Weight  Bearing

## 2016-02-10 ENCOUNTER — Encounter (HOSPITAL_COMMUNITY): Payer: Self-pay | Admitting: Emergency Medicine

## 2016-02-10 ENCOUNTER — Ambulatory Visit (HOSPITAL_COMMUNITY)
Admission: EM | Admit: 2016-02-10 | Discharge: 2016-02-10 | Disposition: A | Discharge status: Home / Self Care | Payer: Self-pay | Attending: Family Medicine | Admitting: Family Medicine

## 2016-02-10 DIAGNOSIS — L723 Sebaceous cyst: Secondary | ICD-10-CM

## 2016-02-10 MED ORDER — SULFAMETHOXAZOLE-TRIMETHOPRIM 800-160 MG PO TABS: 1.0000 | ORAL_TABLET | Freq: Two times a day (BID) | ORAL | 0 refills | Status: AC

## 2016-02-10 NOTE — ED Triage Notes (Signed)
Here for a painless cyst behind right ear  Denies fevers, inj/trauma, itching  Sx only include swelling  A&O x4... NAD

## 2016-02-10 NOTE — Discharge Instructions (Signed)
You have a sebaceous cyst behind your right ear. It is not large or sufficiently formed enough to be drained in clinic yet within an urgent care setting. For cosmetic purposes, a dermatologist may be able to remove it for you should you wish to follow up with one. In the mean time, I have written a prescription for Bactrim DS, take 1 tablet twice a day for 7 days, and apply warm compresses on the area for 15 minutes at a time for 4 times a day.  Should the cyst grow larger or come to a head, return to clinic for drainage.

## 2016-02-10 NOTE — ED Provider Notes (Signed)
CSN: [id number]     Arrival date & time 02/10/16  [id number] History   First MD Initiated Contact with Patient 02/10/16 1016     Chief Complaint  Patient presents with  . Cyst   (Consider location/radiation/quality/duration/timing/severity/associated sxs/prior Treatment) 36 year old male presents to clinic with a chief complaint of swelling under his right ear that has been present for 3 days. He denies pain, fever, nausea, or any systemic symptoms, he has no problem chewing or swallowing, no problems with hearing. He denies any trauma to the area.   The history is provided by the patient. The history is limited by a language barrier. A language interpreter was used.    Past Medical History:  Diagnosis Date  . Hypertension    History reviewed. No pertinent surgical history. Family History  Problem Relation Age of Onset  . Diabetes Father   . Diabetes Sister    Social History  Substance Use Topics  . Smoking status: Current Some Day Smoker    Years: 10.00    Types: Cigars  . Smokeless tobacco: Never Used  . Alcohol use No    Review of Systems  Reason unable to perform ROS: as covered in HPI.  All other systems reviewed and are negative.   Allergies  Patient has no known allergies.  Home Medications   Prior to Admission medications   Medication Sig Start Date End Date Taking? Authorizing Provider  hydrocortisone cream 1 % Apply small amount to anal area twice a day 07/29/14   Collene GobbleSteven A Daub, MD  ibuprofen (ADVIL,MOTRIN) 800 MG tablet Take 1 tablet (800 mg total) by mouth 3 (three) times daily. 11/03/15   Domenick GongAshley Mortenson, MD  lisinopril-hydrochlorothiazide (PRINZIDE,ZESTORETIC) 10-12.5 MG per tablet Take 1 tablet by mouth daily. Patient not taking: Reported on 07/29/2014 11/06/12   Pearline CablesJessica C Copland, MD  metFORMIN (GLUCOPHAGE) 500 MG tablet Take 1 a day for the first week then 1 twice a day 07/29/14   Collene GobbleSteven A Daub, MD  miconazole (MICATIN) 2 % cream Apply small amount to the  foreskin twice a day 07/29/14   Collene GobbleSteven A Daub, MD  polyethylene glycol powder (GLYCOLAX/MIRALAX) powder Take 17 g by mouth daily. Patient not taking: Reported on 07/29/2014 12/03/12   Carmelina DaneJeffery S Anderson, MD  sulfamethoxazole-trimethoprim (BACTRIM DS,SEPTRA DS) 800-160 MG tablet Take 1 tablet by mouth 2 (two) times daily. 02/10/16 02/17/16  Dorena BodoLawrence Meribeth Vitug, NP  traMADol (ULTRAM) 50 MG tablet 1-2 tabs po q 6 hr prn pain Maximum dose= 8 tablets per day 11/03/15   Domenick GongAshley Mortenson, MD   Meds Ordered and Administered this Visit  Medications - No data to display  BP 92/57 (BP Location: Right Arm)   Pulse 77   Temp 98.6 F (37 C) (Oral)   Resp 18   SpO2 96%  No data found.   Physical Exam  Constitutional: He is oriented to person, place, and time. He appears well-developed and well-nourished. No distress.  HENT:  Head: Normocephalic and atraumatic.    Right Ear: Tympanic membrane and external ear normal.  Left Ear: Tympanic membrane and external ear normal.  Nose: Nose normal.  Mouth/Throat: Oropharynx is clear and moist.  Neurological: He is alert and oriented to person, place, and time.  Skin: Skin is warm and dry. Capillary refill takes less than 2 seconds. He is not diaphoretic.  Psychiatric: He has a normal mood and affect.  Nursing note and vitals reviewed.   Urgent Care Course     Procedures (including  critical care time)  Labs Review Labs Reviewed - No data to display  Imaging Review No results found.   Visual Acuity Review  Right Eye Distance:   Left Eye Distance:   Bilateral Distance:    Right Eye Near:   Left Eye Near:    Bilateral Near:         MDM   1. Sebaceous cyst   You have a sebaceous cyst behind your right ear. It is not large or sufficiently formed enough to be drained in clinic yet within an urgent care setting. For cosmetic purposes, a dermatologist may be able to remove it for you should you wish to follow up with one. In the mean time, I  have written a prescription for Bactrim DS, take 1 tablet twice a day for 7 days, and apply warm compresses on the area for 15 minutes at a time for 4 times a day.  Should the cyst grow larger or come to a head, return to clinic for drainage.      Dorena Bodo, NP 02/10/16 1051

## 2016-05-04 ENCOUNTER — Other Ambulatory Visit: Payer: Self-pay | Admitting: Otolaryngology

## 2016-05-04 DIAGNOSIS — R221 Localized swelling, mass and lump, neck: Secondary | ICD-10-CM

## 2016-05-05 ENCOUNTER — Ambulatory Visit
Admission: RE | Admit: 2016-05-05 | Discharge: 2016-05-05 | Disposition: A | Discharge status: Home / Self Care | Payer: No Typology Code available for payment source | Source: Ambulatory Visit | Attending: Otolaryngology | Admitting: Otolaryngology

## 2016-05-05 DIAGNOSIS — R221 Localized swelling, mass and lump, neck: Secondary | ICD-10-CM

## 2016-05-05 MED ORDER — IOPAMIDOL (ISOVUE-300) INJECTION 61%
75.0000 mL | Freq: Once | INTRAVENOUS | Status: AC | PRN
Start: 2016-05-05 — End: 2016-05-05
  Administered 2016-05-05: 75 mL via INTRAVENOUS

## 2017-05-04 ENCOUNTER — Ambulatory Visit: Payer: Self-pay | Admitting: Emergency Medicine

## 2017-05-04 ENCOUNTER — Encounter: Payer: Self-pay | Admitting: Emergency Medicine

## 2017-05-04 ENCOUNTER — Other Ambulatory Visit: Payer: Self-pay

## 2017-05-04 ENCOUNTER — Ambulatory Visit (INDEPENDENT_AMBULATORY_CARE_PROVIDER_SITE_OTHER): Payer: Self-pay

## 2017-05-04 VITALS — BP 116/76 | HR 78 | Temp 98.6°F | Resp 16 | Ht 63.5 in | Wt 181.0 lb

## 2017-05-04 DIAGNOSIS — M25522 Pain in left elbow: Secondary | ICD-10-CM

## 2017-05-04 DIAGNOSIS — S59902A Unspecified injury of left elbow, initial encounter: Secondary | ICD-10-CM | POA: Insufficient documentation

## 2017-05-04 NOTE — Progress Notes (Signed)
Dakota CoombeJulian G Wilkinson 37 y.o.   Chief Complaint  Patient presents with  . Elbow Injury    per patient hit LEFT elbow and 5-6 months ago now a knot     HISTORY OF PRESENT ILLNESS: [name] complaining of left elbow bump since injury 5- 6 months ago.  Slightly tender.  HPI   Prior to Admission medications   Medication Sig Start Date End Date Taking? Authorizing Provider  hydrocortisone cream 1 % Apply small amount to anal area twice a day Patient not taking: Reported on 05/04/2017 07/29/14   Collene Gobbleaub, Steven A, MD  ibuprofen (ADVIL,MOTRIN) 800 MG tablet Take 1 tablet (800 mg total) by mouth 3 (three) times daily. Patient not taking: Reported on 05/04/2017 11/03/15   Domenick GongMortenson, Ashley, MD  lisinopril-hydrochlorothiazide (PRINZIDE,ZESTORETIC) 10-12.5 MG per tablet Take 1 tablet by mouth daily. Patient not taking: Reported on 07/29/2014 11/06/12   Copland, Gwenlyn FoundJessica C, MD  metFORMIN (GLUCOPHAGE) 500 MG tablet Take 1 a day for the first week then 1 twice a day Patient not taking: Reported on 05/04/2017 07/29/14   Collene Gobbleaub, Steven A, MD  miconazole (MICATIN) 2 % cream Apply small amount to the foreskin twice a day Patient not taking: Reported on 05/04/2017 07/29/14   Collene Gobbleaub, Steven A, MD  polyethylene glycol powder (GLYCOLAX/MIRALAX) powder Take 17 g by mouth daily. Patient not taking: Reported on 07/29/2014 12/03/12   Carmelina DaneAnderson, Jeffery S, MD  traMADol Janean Sark(ULTRAM) 50 MG tablet 1-2 tabs po q 6 hr prn pain Maximum dose= 8 tablets per day Patient not taking: Reported on 05/04/2017 11/03/15   Domenick GongMortenson, Ashley, MD    No Known Allergies  Patient Active Problem List   Diagnosis Date Noted  . Type II or unspecified type diabetes mellitus without mention of complication, not stated as uncontrolled 11/06/2012  . HTN (hypertension) 11/06/2012    Past Medical History:  Diagnosis Date  . Hypertension     No past surgical history on file.  Social History   Socioeconomic History  . Marital status:  Married    Spouse name: Not on file  . Number of children: Not on file  . Years of education: Not on file  . Highest education level: Not on file  Occupational History  . Not on file  Social Needs  . Financial resource strain: Not on file  . Food insecurity:    Worry: Not on file    Inability: Not on file  . Transportation needs:    Medical: Not on file    Non-medical: Not on file  Tobacco Use  . Smoking status: Current Some Day Smoker    Years: 10.00    Types: Cigars  . Smokeless tobacco: Never Used  Substance and Sexual Activity  . Alcohol use: No    Alcohol/week: 0.0 oz  . Drug use: Not on file  . Sexual activity: Not on file  Lifestyle  . Physical activity:    Days per week: Not on file    Minutes per session: Not on file  . Stress: Not on file  Relationships  . Social connections:    Talks on phone: Not on file    Gets together: Not on file    Attends religious service: Not on file    Active member of club or organization: Not on file    Attends meetings of clubs or organizations: Not on file    Relationship status: Not on file  . Intimate partner violence:    Fear  of current or ex partner: Not on file    Emotionally abused: Not on file    Physically abused: Not on file    Forced sexual activity: Not on file  Other Topics Concern  . Not on file  Social History Narrative  . Not on file    Family History  Problem Relation Age of Onset  . Diabetes Father   . Diabetes Sister      Review of Systems  Constitutional: Negative.  Negative for chills and fever.  Respiratory: Negative for shortness of breath.   Gastrointestinal: Negative for nausea and vomiting.  Musculoskeletal: Positive for joint pain (left elbow).  Skin: Negative.  Negative for rash.  Neurological: Negative for sensory change and focal weakness.  Endo/Heme/Allergies: Negative.   All other systems reviewed and are negative.  Vitals:   05/04/17 1622 05/04/17 1630  BP: (!) 148/88 116/76    Pulse: 78   Resp: 16   Temp: 98.6 F (37 C)   SpO2: 98%      Physical Exam  Constitutional: He is oriented to person, place, and time. He appears well-developed and well-nourished.  HENT:  Head: Normocephalic.  Eyes: Pupils are equal, round, and reactive to light. EOM are normal.  Neck: Normal range of motion.  Cardiovascular: Normal rate and regular rhythm.  Pulmonary/Chest: Effort normal and breath sounds normal.  Musculoskeletal: He exhibits no edema.  Left elbow: No bruising or erythema.  Full range of motion.  Small nontender visible hard lump to lateral aspect.   Neurological: He is alert and oriented to person, place, and time.  Skin: Skin is warm and dry.  Psychiatric: He has a normal mood and affect. His behavior is normal.  Vitals reviewed.  Dg Elbow Complete Left (3+view)  Result Date: 05/04/2017 CLINICAL DATA:  Elbow injury EXAM: LEFT ELBOW - COMPLETE 3+ VIEW COMPARISON:  None. FINDINGS: There is no evidence of fracture, dislocation, or joint effusion. There is no evidence of arthropathy or other focal bone abnormality. Soft tissues are unremarkable. IMPRESSION: Negative. Electronically Signed   By: Marlan Palau M.D.   On: 05/04/2017 16:55     ASSESSMENT & PLAN: Richardson was seen today for elbow injury.  Diagnoses and all orders for this visit:  Injury of left elbow, initial encounter -     DG ELBOW COMPLETE LEFT (3+VIEW); Future  Left elbow pain -     DG ELBOW COMPLETE LEFT (3+VIEW); Future    Patient Instructions       IF you received an x-ray today, you will receive an invoice from [facility]. Please contact [facility] at [phone] with questions or concerns regarding your invoice.   IF you received labwork today, you will receive an invoice from [facility]. Please contact [facility] at [phone] with questions or concerns regarding your invoice.   Our billing staff will not be able to assist you with questions regarding bills  from these companies.  You will be contacted with the lab results as soon as they are available. The fastest way to get your results is to activate your My Chart account. Instructions are located on the last page of this paperwork. If you have not heard from [place] regarding the results in 2 weeks, please contact this office.     Contusin en el codo (Elbow Contusion) Una contusin en el codo es un hematoma profundo en este. Las contusiones son el resultado de un traumatismo cerrado en los tejidos y las fibras musculares que estn debajo de la  piel. La lesin causa una hemorragia debajo de la piel. La [employer] contusin puede tornarse de color [place], morado o [place]. Las lesiones menores causarn contusiones indoloras; sin embargo, en las contusiones ms graves, Chief Technology Officer y la hinchazn pueden prolongarse durante algunas semanas. CAUSAS Generalmente, la afeccin se debe a un traumatismo, una fuerza directa o un golpe fuerte en el codo. SNTOMAS Los sntomas de esta afeccin incluyen lo siguiente:  Hinchazn del codo.  Dolor y sensibilidad con la palpacin del codo.  Cambio de color en el codo. La zona puede enrojecerse y [employer], [place] o [place]. DIAGNSTICO Esta afeccin se diagnostica en funcin de un examen fsico y de la historia clnica. Puede ser necesario hacer una radiografa para determinar si hubo lesiones asociadas, como huesos rotos (fracturas). TRATAMIENTO Podrn colocarle un cabestrillo o frula para soportar la lesin. Generalmente, el mejor tratamiento para esta afeccin incluye hacer reposo, aplicarse hielo, ejercer presin (compresin) y elevar la zona afectada. Frecuentemente, esto se conoce como el tratamiento de RHCE (Reposo, hielo, compresin, elevacin). Para Human resources officer, tambin pueden recomendarle antiinflamatorios de [place]. Tambin le mostrarn cmo hacer ejercicios de flexibilidad articular. INSTRUCCIONES PARA EL CUIDADO EN EL  HOGAR Tratamiento de RHCE  Mantenga la zona de la lesin en reposo.  Si se lo indican, aplique hielo sobre la zona lesionada: ? Ponga el hielo en una bolsa plstica. ? Coloque una [employer] piel y la bolsa de hielo. ? Coloque el hielo durante , 2 a 3veces por da.  Si se lo indican, ejerza una compresin suave en la zona de la lesin con una venda elstica. Asegrese de que la venda no est [place]. [place] y vuelva a colocarse la venda como se lo haya indicado el mdico.  Cuando est sentado o acostado, eleve la zona de la lesin por encima del nivel del corazn. Si tiene una frula:  Use la frula como se lo haya indicado el mdico. Qutesela solamente como se lo haya indicado el mdico.  Afloje la frula si los dedos se le entumecen, siente hormigueos o se le enfran y se tornan de Research officer, trade union.  No deje que su frula se moje si no es impermeable.  Si la frula no es impermeable, cbrala con una bolsa de plstico hermtica mientras toma un bao de inmersin o una ducha.  Mantenga la frula limpia. Otras indicaciones  Use guantes como se lo haya indicado el mdico, si corresponde.  Use el codo solo como se lo haya indicado el mdico. Le indicarn que haga ejercicios de flexibilidad articular. Hgalos como se lo hayan indicado.  Tome los medicamentos de venta libre y los recetados solamente como se lo haya indicado el mdico.  Oceanographer a todas las visitas de control como se lo haya indicado el mdico. Esto es importante. SOLICITE ATENCIN MDICA SI:  Los sntomas no mejoran despus de [address] de [place].  Observa un aumento del enrojecimiento, hinchazn o dolor en el codo.  Tiene dificultad para mover la zona lesionada.  La hinchazn o el dolor no se [employer]. SOLICITE ATENCIN MDICA DE INMEDIATO SI:  Siente dolor intenso.  Tiene adormecimiento en la mano o en los dedos.  La mano o los dedos se tornan plidos o  fros.  Tiene las manos o los dedos hinchados.  No puede mover los dedos o la [place]. Esta informacin no tiene Theme park manager el consejo del mdico. Asegrese de hacerle al mdico cualquier pregunta que  tenga. Document Released: 04/02/2008 Document Revised: 04/28/2015 Document Reviewed: 08/19/2014 Elsevier Interactive Patient Education  2018 Elsevier Inc.      Edwina Barth, MD [facility]

## 2017-05-04 NOTE — Patient Instructions (Addendum)
   IF you received an x-ray today, you will receive an invoice from [facility]. Please contact [facility] at [phone] with questions or concerns regarding your invoice.   IF you received labwork today, you will receive an invoice from [facility]. Please contact [facility] at [phone] with questions or concerns regarding your invoice.   Our billing staff will not be able to assist you with questions regarding bills from these companies.  You will be contacted with the lab results as soon as they are available. The fastest way to get your results is to activate your My Chart account. Instructions are located on the last page of this paperwork. If you have not heard from us regarding the results in 2 weeks, please contact this office.     Contusin en el codo (Elbow Contusion) Una contusin en el codo es un hematoma profundo en este. Las contusiones son el resultado de un traumatismo cerrado en los tejidos y las fibras musculares que estn debajo de la piel. La lesin causa una hemorragia debajo de la piel. La piel sobre la contusin puede tornarse de color [place], mor[place] o [place]. Las lesiones menores causarn contusiones indoloras; sin embargo, en las contusiones ms graves, el dolor y la hinchazn pueden prolongarse durante algunas semanas. CAUSAS Generalmente, la afeccin se debe a un traumatismo, una fuerza directa o un golpe fuerte en el codo. SNTOMAS Los sntomas de esta afeccin incluyen lo siguiente:  Hinchazn del codo.  Dolor y sensibilidad con la palpacin del codo.  Cambio de color en el codo. La zona puede enrojecerse y luego ponerse azul, morada o [place]arilla. DIAGNSTICO Esta afeccin se diagnostica en funcin de un examen fsico y de la historia clnica. Puede ser necesario hacer una radiografa para determinar si hubo lesiones asociadas, como huesos rotos (fracturas). TRATAMIENTO Podrn colocarle un cabestrillo o frula para soportar la lesin.  Generalmente, el mejor tratamiento para esta afeccin incluye hacer reposo, aplicarse hielo, ejercer presin (compresin) y elevar la zona afectada. Frecuentemente, esto se conoce como el tratamiento de RHCE (Reposo, hielo, compresin, elevacin). Para controlar el dolor, tambin pueden recomendarle antiinflamatorios de venta libre. Tambin le mostrarn cmo hacer ejercicios de flexibilidad articular. INSTRUCCIONES PARA EL CUIDADO EN EL HOGAR Tratamiento de RHCE  Mantenga la zona de la lesin en reposo.  Si se lo indican, aplique hielo sobre la zona lesionada: ? Ponga el hielo en una bolsa plstica. ? Coloque una toalla entre la piel y la bolsa de hielo. ? Coloque el hielo durante 20minutos, 2 a 3veces por da.  Si se lo indican, ejerza una compresin suave en la zona de la lesin con una venda elstica. Asegrese de que la venda no est muy ajustada. Qutese y vuelva a colocarse la venda como se lo haya indicado el mdico.  Cuando est sentado o acostado, eleve la zona de la lesin por encima del nivel del corazn. Si tiene una frula:  Use la frula como se lo haya indicado el mdico. Qutesela solamente como se lo haya indicado el mdico.  Afloje la frula si los dedos se le entumecen, siente hormigueos o se le enfran y se tornan de color azul.  No deje que su frula se moje si no es impermeable.  Si la frula no es impermeable, cbrala con una bolsa de plstico hermtica mientras toma un bao de inmersin o una ducha.  Mantenga la frula limpia. Otras indicaciones  Use guantes como se lo haya indicado el mdico, si corresponde.  Use el codo   solo como se lo haya indicado el mdico. Le indicarn que haga ejercicios de flexibilidad articular. Hgalos como se lo hayan indicado.  Tome los medicamentos de venta libre y los recetados solamente como se lo haya indicado el mdico.  Concurra a todas las visitas de control como se lo haya indicado el mdico. Esto es  importante. SOLICITE ATENCIN MDICA SI:  Los sntomas no mejoran despus de varios das de tratamiento.  Observa un aumento del enrojecimiento, hinchazn o dolor en el codo.  Tiene dificultad para mover la zona lesionada.  La hinchazn o el dolor no se alivian con los medicamentos. SOLICITE ATENCIN MDICA DE INMEDIATO SI:  Siente dolor intenso.  Tiene adormecimiento en la mano o en los dedos.  La mano o los dedos se tornan plidos o fros.  Tiene las manos o los dedos hinchados.  No puede mover los dedos o la mueca. Esta informacin no tiene como fin reemplazar el consejo del mdico. Asegrese de hacerle al mdico cualquier pregunta que tenga. Document Released: 04/02/2008 Document Revised: 04/28/2015 Document Reviewed: 08/19/2014 Elsevier Interactive Patient Education  2018 Elsevier Inc.  

## 2017-09-07 ENCOUNTER — Other Ambulatory Visit: Payer: Self-pay

## 2017-09-07 ENCOUNTER — Encounter (HOSPITAL_COMMUNITY): Payer: Self-pay | Admitting: Emergency Medicine

## 2017-09-07 ENCOUNTER — Ambulatory Visit (HOSPITAL_COMMUNITY)
Admission: EM | Admit: 2017-09-07 | Discharge: 2017-09-07 | Disposition: A | Discharge status: Home / Self Care | Payer: Self-pay | Attending: Physician Assistant | Admitting: Physician Assistant

## 2017-09-07 DIAGNOSIS — J40 Bronchitis, not specified as acute or chronic: Secondary | ICD-10-CM

## 2017-09-07 MED ORDER — PREDNISONE 50 MG PO TABS: 50.0000 mg | ORAL_TABLET | Freq: Every day | ORAL | 0 refills | Status: DC

## 2017-09-07 MED ORDER — IPRATROPIUM BROMIDE 0.06 % NA SOLN: 2.0000 | Freq: Four times a day (QID) | NASAL | 0 refills | Status: AC

## 2017-09-07 MED ORDER — FLUTICASONE PROPIONATE 50 MCG/ACT NA SUSP: 2.0000 | Freq: Every day | NASAL | 0 refills | Status: AC

## 2017-09-07 MED ORDER — AZITHROMYCIN 250 MG PO TABS: 250.0000 mg | ORAL_TABLET | Freq: Every day | ORAL | 0 refills | Status: AC

## 2017-09-07 NOTE — Discharge Instructions (Signed)
Start azithromycin and prednisone as directed. Start flonase, atrovent nasal spray for nasal congestion/drainage. You can use over the counter nasal saline rinse such as neti pot for nasal congestion. Keep hydrated, your urine should be clear to pale yellow in color. Tylenol/motrin for fever and pain. Monitor for any worsening of symptoms, chest pain, shortness of breath, wheezing, swelling of the throat, follow up for reevaluation.   For sore throat/cough try using a honey-based tea. Use 3 teaspoons of honey with juice squeezed from half lemon. Place shaved pieces of ginger into 1/2-1 cup of water and warm over stove top. Then mix the ingredients and repeat every 4 hours as needed.

## 2017-09-07 NOTE — ED Provider Notes (Signed)
[facility]    CSN: [id number] Arrival date & time: 09/07/17  0805     History   Chief Complaint Chief Complaint  Patient presents with  . URI    HPI Dakota Wilkinson is a 37 y.o. male.   37 year old male comes in for 5 to 6-day history of URI symptoms.  HPI obtained by patient through video translator.  He has had productive cough, worse at night.  Nasal congestion, chest congestion, hoarseness.  Denies fever, chills, night sweats.  Denies chest pain, hemoptysis.  States she has difficulty breathing due to nasal congestion.  Denies wheezing.  Former smoker.  Has been using honey tea for throat with mild relief.  No sick contact.      Past Medical History:  Diagnosis Date  . Hypertension     Patient Active Problem List   Diagnosis Date Noted  . Injury of left elbow 05/04/2017  . Left elbow pain 05/04/2017  . Type II or unspecified type diabetes mellitus without mention of complication, not stated as uncontrolled 11/06/2012  . HTN (hypertension) 11/06/2012    History reviewed. No pertinent surgical history.     Home Medications    Prior to Admission medications   Medication Sig Start Date End Date Taking? Authorizing Provider  azithromycin (ZITHROMAX) 250 MG tablet Take 1 tablet (250 mg total) by mouth daily. Take first 2 tablets together, then 1 every day until finished. 09/07/17   Cathie HoopsYu, Janit Cutter V, PA-C  fluticasone (FLONASE) 50 MCG/ACT nasal spray Place 2 sprays into both nostrils daily. 09/07/17   Cathie HoopsYu, Halbert Jesson V, PA-C  ibuprofen (ADVIL,MOTRIN) 800 MG tablet Take 1 tablet (800 mg total) by mouth 3 (three) times daily. Patient not taking: Reported on 05/04/2017 11/03/15   Domenick GongMortenson, Ashley, MD  ipratropium (ATROVENT) 0.06 % nasal spray Place 2 sprays into both nostrils 4 (four) times daily. 09/07/17   Cathie HoopsYu, Tashianna Broome V, PA-C  lisinopril-hydrochlorothiazide (PRINZIDE,ZESTORETIC) 10-12.5 MG per tablet Take 1 tablet by mouth daily. Patient not taking: Reported on 07/29/2014  11/06/12   Copland, Gwenlyn FoundJessica C, MD  metFORMIN (GLUCOPHAGE) 500 MG tablet Take 1 a day for the first week then 1 twice a day Patient not taking: Reported on 05/04/2017 07/29/14   Collene Gobbleaub, Steven A, MD  miconazole (MICATIN) 2 % cream Apply small amount to the foreskin twice a day Patient not taking: Reported on 05/04/2017 07/29/14   Collene Gobbleaub, Steven A, MD  polyethylene glycol powder (GLYCOLAX/MIRALAX) powder Take 17 g by mouth daily. Patient not taking: Reported on 07/29/2014 12/03/12   Carmelina DaneAnderson, Jeffery S, MD  predniSONE (DELTASONE) 50 MG tablet Take 1 tablet (50 mg total) by mouth daily. 09/07/17   Belinda FisherYu, Birdia Jaycox V, PA-C  traMADol (ULTRAM) 50 MG tablet 1-2 tabs po q 6 hr prn pain Maximum dose= 8 tablets per day Patient not taking: Reported on 05/04/2017 11/03/15   Domenick GongMortenson, Ashley, MD    Family History Family History  Problem Relation Age of Onset  . Diabetes Father   . Diabetes Sister     Social History Social History   Tobacco Use  . Smoking status: Current Some Day Smoker    Years: 10.00    Types: Cigars  . Smokeless tobacco: Never Used  Substance Use Topics  . Alcohol use: No    Alcohol/week: 0.0 standard drinks  . Drug use: Not on file     Allergies   Patient has no known allergies.   Review of Systems Review of Systems  Reason unable to perform  ROS: See HPI as above.   Physical Exam Triage Vital Signs ED Triage Vitals  Enc Vitals Group     BP 09/07/17 0832 (!) 150/98     Pulse Rate 09/07/17 0832 95     Resp 09/07/17 0832 18     Temp 09/07/17 0832 98 F (36.7 C)     Temp Source 09/07/17 0832 Oral     SpO2 09/07/17 0832 97 %     Weight --      Height --      Head Circumference --      Peak Flow --      Pain Score 09/07/17 0835 0     Pain Loc --      Pain Edu? --      Excl. in GC? --    No data found.  Updated Vital Signs BP (!) 150/98 (BP Location: Right Arm)   Pulse 95   Temp 98 F (36.7 C) (Oral)   Resp 18   SpO2 97%   Physical Exam  Constitutional: He is  oriented to person, place, and time. He appears well-developed and well-nourished. No distress.  HENT:  Head: Normocephalic and atraumatic.  Right Ear: Tympanic membrane, external ear and ear canal normal. Tympanic membrane is not erythematous and not bulging.  Left Ear: Tympanic membrane, external ear and ear canal normal. Tympanic membrane is not erythematous and not bulging.  Nose: Nose normal. Right sinus exhibits no maxillary sinus tenderness and no frontal sinus tenderness. Left sinus exhibits no maxillary sinus tenderness and no frontal sinus tenderness.  Mouth/Throat: Uvula is midline, oropharynx is clear and moist and mucous membranes are normal.  Eyes: Pupils are equal, round, and reactive to light. Conjunctivae are normal.  Neck: Normal range of motion. Neck supple.  Cardiovascular: Normal rate, regular rhythm and normal heart sounds. Exam reveals no gallop and no friction rub.  No murmur heard. Pulmonary/Chest: Effort normal. No accessory muscle usage. No respiratory distress.  Mild diffuse rhonchi that clears with cough.   Lymphadenopathy:    He has no cervical adenopathy.  Neurological: He is alert and oriented to person, place, and time.  Skin: Skin is warm and dry. He is not diaphoretic.  Psychiatric: He has a normal mood and affect. His behavior is normal. Judgment normal.   UC Treatments / Results  Labs (all labs ordered are listed, but only abnormal results are displayed) Labs Reviewed - No data to display  EKG None  Radiology No results found.  Procedures Procedures (including critical care time)  Medications Ordered in UC Medications - No data to display  Initial Impression / Assessment and Plan / UC Course  I have reviewed the triage vital signs and the nursing notes.  Pertinent labs & imaging results that were available during my care of the patient were reviewed by me and considered in my medical decision making (see chart for details).      Azithromycin and prednisone for bronchitis. Other symptomatic treatment discussed. Push fluids. Return precautions given. Patient expresses understanding and agrees to plan.  Final Clinical Impressions(s) / UC Diagnoses   Final diagnoses:  Bronchitis    ED Prescriptions    Medication Sig Dispense Auth. Provider   azithromycin (ZITHROMAX) 250 MG tablet Take 1 tablet (250 mg total) by mouth daily. Take first 2 tablets together, then 1 every day until finished. 6 tablet Jaymarie Yeakel V, PA-C   predniSONE (DELTASONE) 50 MG tablet Take 1 tablet (50 mg total) by mouth daily. 5  tablet Usbaldo Pannone V, PA-C   ipratropium (ATROVENT) 0.06 % nasal spray Place 2 sprays into both nostrils 4 (four) times daily. 15 mL Tahtiana Rozier V, PA-C   fluticasone (FLONASE) 50 MCG/ACT nasal spray Place 2 sprays into both nostrils daily. 1 g Threasa AlphaYu, Sakshi Sermons V, PA-C        Saxon Barich V, [place] JerseyPA-C 09/07/17 [phone]

## 2017-09-07 NOTE — ED Triage Notes (Signed)
Hugo [id number]-interpreter.    Patient has chest congestion.  Patient has a cough, productive cough and is worse at night.  Patient is hoarse.  No throat drainage.  No fever.  Symptoms for 5-6 days

## 2019-11-11 IMAGING — DX DG ELBOW COMPLETE 3+V*L*
5 series · 5 of 5 positions shown · non-contrast
Comparison: None.

CLINICAL DATA: Elbow injury

EXAM:
LEFT ELBOW - COMPLETE 3+ VIEW

[elbow ap]
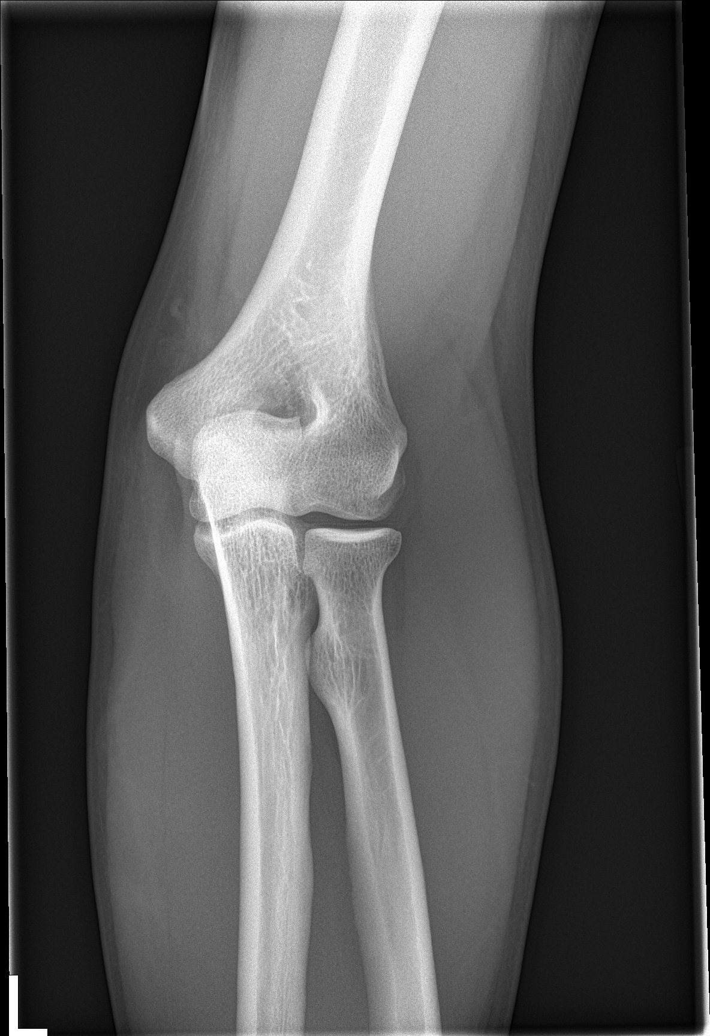

[elbow obl (1 of 2)]
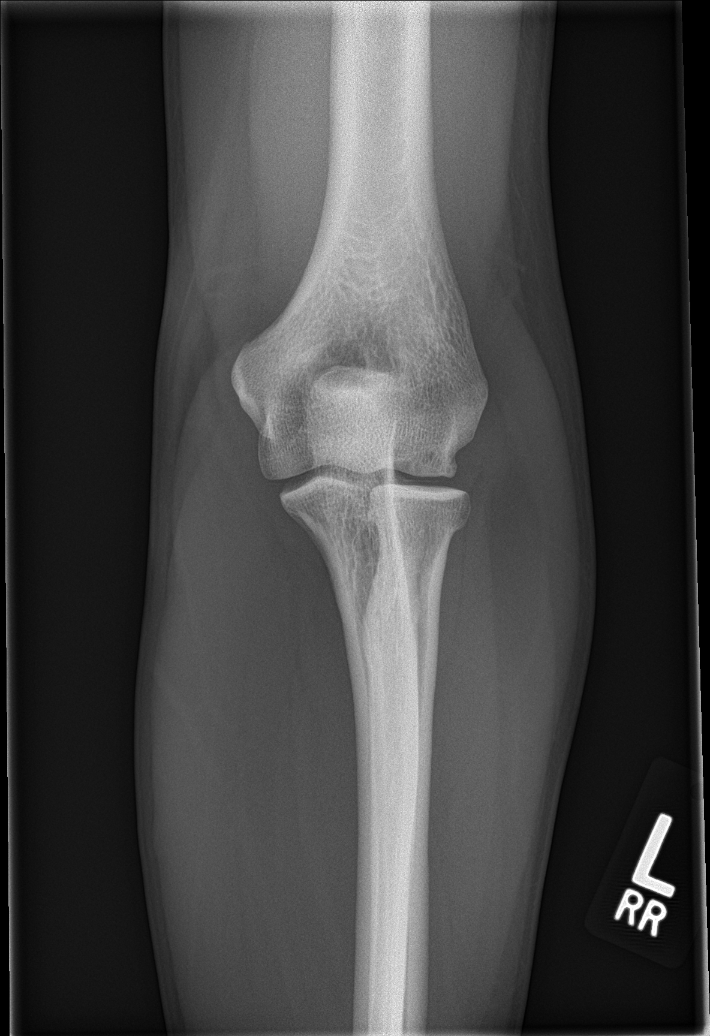

[elbow obl (2 of 2)]
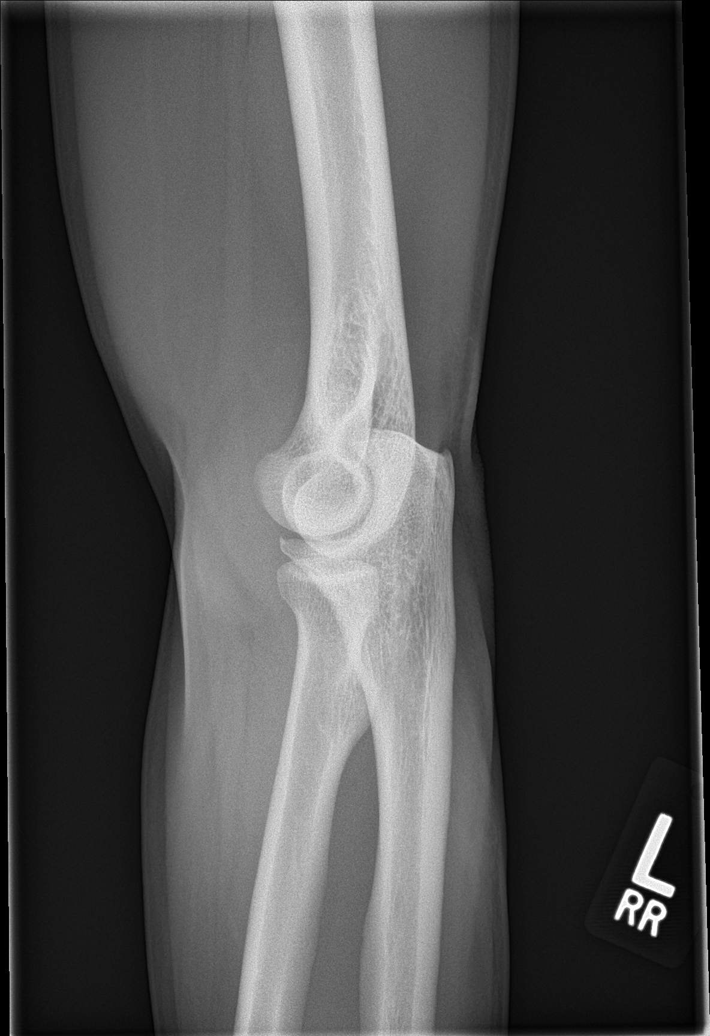

[elbow lat (1 of 2)]
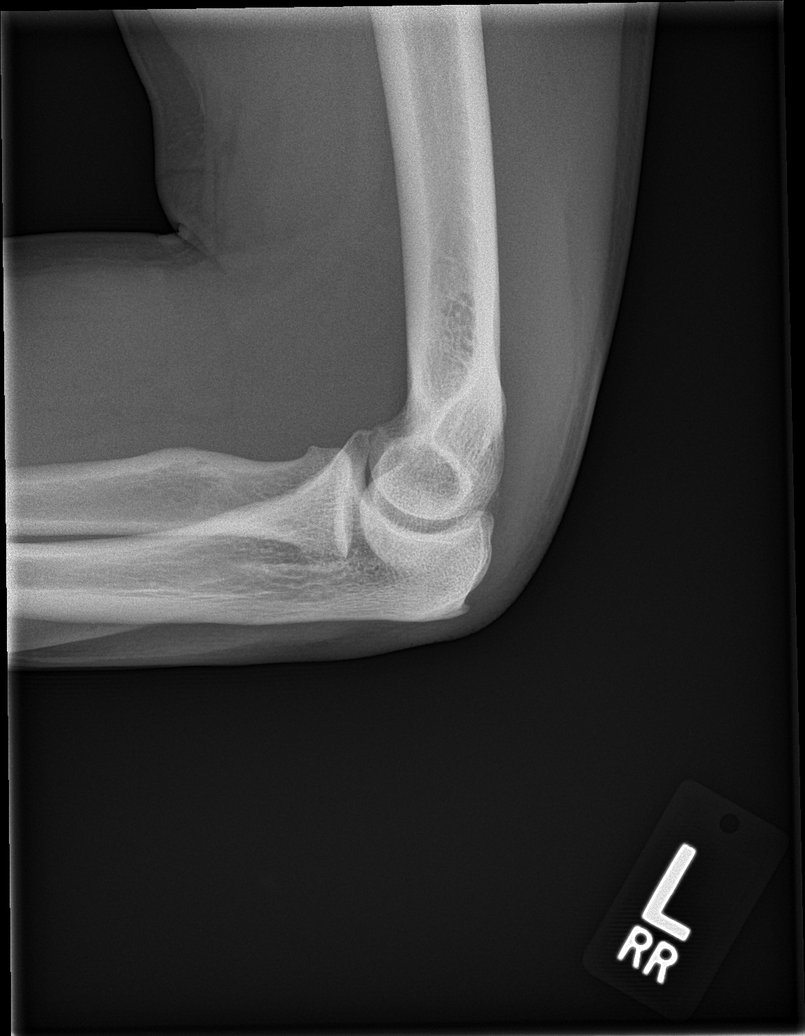

[elbow lat (2 of 2)]
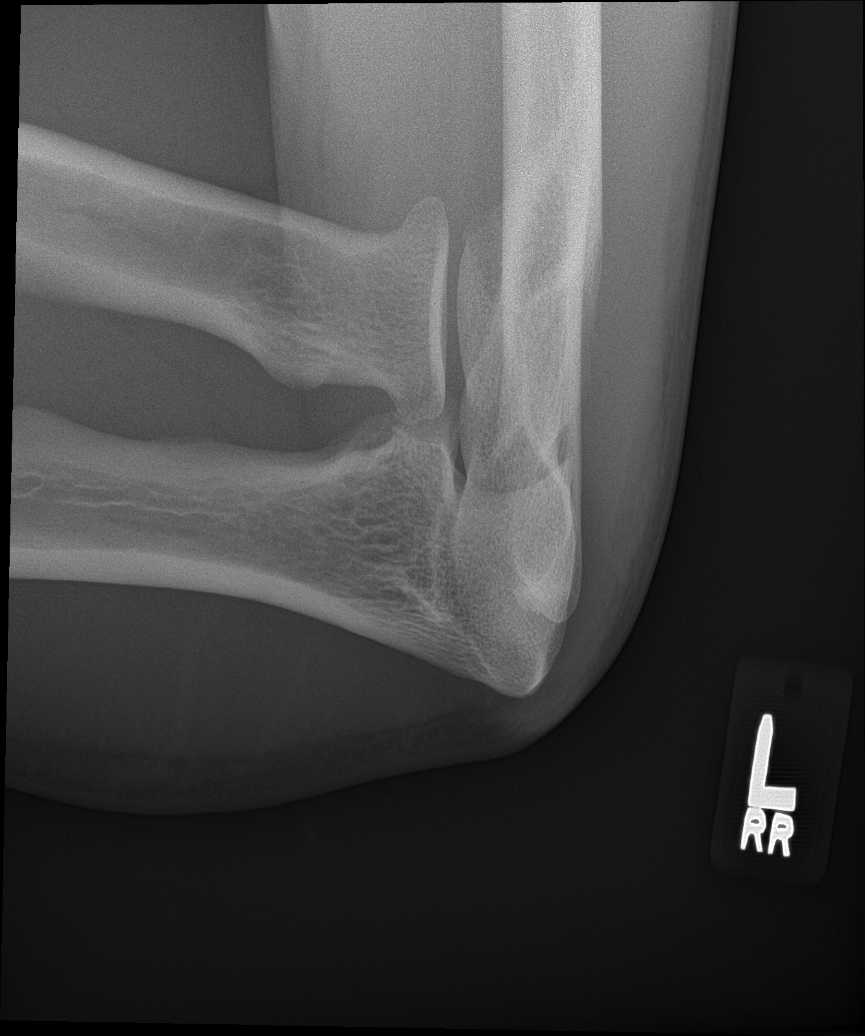

[5 of 5 positions shown; findings below may reference images not displayed]

FINDINGS: There is no evidence of fracture, dislocation, or joint effusion.
There is no evidence of arthropathy or other focal bone abnormality.
Soft tissues are unremarkable.
IMPRESSION: Negative.

## 2023-10-27 ENCOUNTER — Telehealth (HOSPITAL_COMMUNITY): Payer: Self-pay

## 2023-10-27 ENCOUNTER — Encounter (HOSPITAL_COMMUNITY): Payer: Self-pay

## 2023-10-27 ENCOUNTER — Ambulatory Visit (HOSPITAL_COMMUNITY)
Admission: EM | Admit: 2023-10-27 | Discharge: 2023-10-27 | Disposition: A | Discharge status: Home / Self Care | Payer: Self-pay | Attending: Nurse Practitioner | Admitting: Nurse Practitioner

## 2023-10-27 DIAGNOSIS — S46209A Unspecified injury of muscle, fascia and tendon of other parts of biceps, unspecified arm, initial encounter: Secondary | ICD-10-CM

## 2023-10-27 MED ORDER — NAPROXEN 500 MG PO TABS: 500.0000 mg | ORAL_TABLET | Freq: Two times a day (BID) | ORAL | 0 refills | Status: DC

## 2023-10-27 MED ORDER — NAPROXEN 500 MG PO TABS
500.0000 mg | ORAL_TABLET | Freq: Two times a day (BID) | ORAL | 0 refills | Status: AC
Start: 2023-10-27 — End: ?

## 2023-10-27 NOTE — Discharge Instructions (Addendum)
 Dakota fue atendido hoy por una lesin Dakota el bceps derecho que ocurri hace unas semanas mientras realizaba trabajo de construccin pesada. El examen sugiere que lo ms probable es que se trate de una distensin muscular o posiblemente Wilkinson pequeo desgarro Wilkinson, Dakota Wilkinson, Dakota lesin no [place] signos de [place] Wilkinson, Dakota es importante cuidar el brazo y Automotive engineer actividades que puedan [place].  Para ayudar con Dakota recuperacin, Dakota Wilkinson vendaje elstico (ace wrap) durante el da para dar soporte y retirarlo por Dakota noche y para baarse. Aplique hielo Dakota el rea durante unos 20 minutos, dos a tres veces al da, asegurndose de Scientific laboratory technician una toalla entre el hielo y Dakota piel para Company secretary.  Dakota tomar el naproxeno recetado dos veces al da con alimentos, por ejemplo con el desayuno y Dakota cena, para reducir Chief Technology Officer y Dakota inflamacin. Mientras est tomando [employer], no use antiinflamatorios de venta libre como aspirina, Motrin , ibuprofeno o Aleve, ya que esto puede aumentar el riesgo de efectos secundarios. Si es necesario, puede tomar Tylenol (acetaminofn) 1000 mg cada seis horas para Wilkinson alivio adicional del Engineer, mining. Esto equivale a dos tabletas de 500 mg a Dakota vez. Tenga cuidado de no tomar ms de 4000 mg de Tylenol Dakota Wilkinson perodo de 24 horas.  Es importante Medical illustrator objetos pesados, jalar o hacer trabajo por encima de Dakota cabeza con este brazo hasta que reciba autorizacin. El trabajo liviano est permitido si no aumenta su dolor ni hace que el bulto sea ms notorio.  Por favor, programe una cita de seguimiento con Wilkinson especialista Dakota ortopedia si sus sntomas no mejoran dentro de las [employer]. Se ha proporcionado Dakota informacin para el especialista Dakota ortopedia. Ellos pueden recomendar estudios especiales como una resonancia magntica (MRI) o fisioterapia si Dakota lesin no est sanando como se espera.  Busque atencin de emergencia de  inmediato si presenta dolor repentino que empeora, debilidad notable Dakota el brazo, entumecimiento u hormigueo, o si el brazo cambia de color, se pone fro o se hincha. Estos podran ser signos de Wilkinson problema ms Wilkinson.  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 The exam suggests this is most likely a muscle strain or possibly a small partial tear, rather than a complete tear. At this time, the injury does not show signs of severe damage, but it is important to take care of the arm and avoid activities that could make it worse. To help with healing, you should wear an ace wrap during the day to provide support and remove it at night and for showers. Apply ice to the area for about 20 minutes, two to three times a day, making sure to place a towel between the ice and your skin to prevent injury. You should take the prescribed naproxen twice daily with food, such as with breakfast and dinner, to reduce pain and inflammation. While taking this medication, do not use over-the-counter anti-inflammatories such as aspirin, Motrin , ibuprofen , or Aleve, as this may increase the risk of side effects. If needed, you may take Tylenol (acetaminophen) 1000 mg every six hours for additional pain relief. This equals two 500 mg tablets at a time. Be careful not to take more than 4000 mg of Tylenol in a 24-hour period. It is important to avoid heavy lifting, pulling, or overhead work with this arm until cleared. Light duty  work is allowed if it does not increase your pain or make the lump more noticeable. Please schedule a follow-up with an orthopedic specialist if your symptoms do not improve within the next two weeks. Information to the orthopedic specialist has been provided. They may recommend special testing such as an MRI or physical therapy if the injury is not healing as expected. Seek emergency care right away if you develop sudden worsening pain,  noticeable weakness in the arm, numbness or tingling, or if the arm changes color, becomes cold, or looks swollen. These could be signs of a more serious problem.

## 2023-10-27 NOTE — Telephone Encounter (Signed)
 Pharmacy change

## 2023-10-27 NOTE — ED Triage Notes (Addendum)
 Patient presents with a right upper extremity mass x 3 weeks. Denies any recent trauma or pain.

## 2023-10-27 NOTE — ED Provider Notes (Signed)
 [facility]    CSN: [id number] Arrival date & time: 10/27/23  9049      History   Chief Complaint No chief complaint on file.   HPI Dakota Wilkinson is a 43 y.o. male.   Spanish Interpreter  [place] [phone]  Discussed the use of AI scribe software for clinical note transcription with the patient, who gave verbal consent to proceed.   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 The patient reports that while using a machine at work, when he pulled his arm up, his muscle contracted and he subsequently noticed the lump on his bicep. The lump becomes prominent when he flexes his bicep muscle.  The patient experiences pain only when carrying something heavy, describing the pain as occurring in the tendon area and radiating down below. He denies any bruising or color change to the area, stating it has always been the same color. He reports no numbness or tingling in the arm or hand. The patient denies any weakness of the extremity. The lump itself is not painful when flexed, but pain occurs specifically when carrying heavy objects with that hand. He hasn't tried anything for his symptoms.   The following sections of the patient's history were reviewed and updated as appropriate: allergies, current medications, past family history, past medical history, past social history, past surgical history, and problem list.     Past Medical History:  Diagnosis Date   Hypertension     Patient Active Problem List   Diagnosis Date Noted   Injury of left elbow 05/04/2017   Left elbow pain 05/04/2017   Type II or unspecified type diabetes mellitus without mention of complication, not stated as uncontrolled 11/06/2012   HTN (hypertension) 11/06/2012    History reviewed. No pertinent surgical history.     Home Medications    Prior to Admission medications   Medication Sig Start Date End Date Taking?  Authorizing Provider  naproxen (NAPROSYN) 500 MG tablet Take 1 tablet (500 mg total) by mouth 2 (two) times daily with a meal. Take with food to avoid stomach upset. Do not take any additional NSAIDs while on this. You may take tylenol in addition to this if needed for extra pain relief. 10/27/23  Yes Iola Lukes, FNP  azithromycin  (ZITHROMAX ) 250 MG tablet Take 1 tablet (250 mg total) by mouth daily. Take first 2 tablets together, then 1 every day until finished. 09/07/17   Babara, Amy V, PA-C  fluticasone  (FLONASE ) 50 MCG/ACT nasal spray Place 2 sprays into both nostrils daily. 09/07/17   Babara, Amy V, PA-C  ipratropium (ATROVENT ) 0.06 % nasal spray Place 2 sprays into both nostrils 4 (four) times daily. 09/07/17   Babara, Amy V, PA-C  lisinopril -hydrochlorothiazide  (PRINZIDE ,ZESTORETIC ) 10-12.5 MG per tablet Take 1 tablet by mouth daily. Patient not taking: Reported on 07/29/2014 11/06/12   Copland, Harlene BROCKS, MD  metFORMIN  (GLUCOPHAGE ) 500 MG tablet Take 1 a day for the first week then 1 twice a day Patient not taking: Reported on 05/04/2017 07/29/14   Humberto Elspeth LABOR, MD  miconazole  (MICATIN) 2 % cream Apply small amount to the foreskin twice a day Patient not taking: Reported on 05/04/2017 07/29/14   Humberto Elspeth LABOR, MD  polyethylene glycol powder (GLYCOLAX /MIRALAX ) powder Take 17 g by mouth daily. Patient not taking: Reported on 07/29/2014 12/03/12   Lenon Juliane RAMAN, MD  traMADol  (ULTRAM ) 50 MG tablet 1-2 tabs po  q 6 hr prn pain Maximum dose= 8 tablets per day Patient not taking: Reported on 05/04/2017 11/03/15   Van Knee, MD    Family History Family History  Problem Relation Age of Onset   Diabetes Father    Diabetes Sister     Social History Social History   Tobacco Use   Smoking status: Some Days    Types: Cigars   Smokeless tobacco: Never  Substance Use Topics   Alcohol use: No    Alcohol/week: 0.0 standard drinks of alcohol     Allergies   Patient has no known  allergies.   Review of Systems Review of Systems  Musculoskeletal:  Positive for arthralgias.  Neurological:  Negative for weakness and numbness.  All other systems reviewed and are negative.    Physical Exam Triage Vital Signs ED Triage Vitals [10/27/23 1104]  Encounter Vitals Group     BP (!) 163/109     Girls Systolic BP Percentile      Girls Diastolic BP Percentile      Boys Systolic BP Percentile      Boys Diastolic BP Percentile      Pulse Rate 71     Resp 18     Temp 98.7 F (37.1 C)     Temp Source Oral     SpO2 95 %     Weight      Height      Head Circumference      Peak Flow      Pain Score      Pain Loc      Pain Education      Exclude from Growth Chart    No data found.  Updated Vital Signs BP (!) 163/109 (BP Location: Left Arm)   Pulse 71   Temp 98.7 F (37.1 C) (Oral)   Resp 18   SpO2 95%   Visual Acuity Right Eye Distance:   Left Eye Distance:   Bilateral Distance:    Right Eye Near:   Left Eye Near:    Bilateral Near:     Physical Exam Vitals reviewed.  Constitutional:      General: He is awake. He is not in acute distress.    Appearance: Normal appearance. He is well-developed. He is not ill-appearing, toxic-appearing or diaphoretic.  HENT:     Head: Normocephalic.     Right Ear: Hearing normal.     Left Ear: Hearing normal.     Nose: Nose normal.     Mouth/Throat:     Mouth: Mucous membranes are moist.  Eyes:     General: Vision grossly intact.     Conjunctiva/sclera: Conjunctivae normal.  Cardiovascular:     Rate and Rhythm: Normal rate and regular rhythm.     Heart sounds: Normal heart sounds.  Pulmonary:     Effort: Pulmonary effort is normal.     Breath sounds: Normal breath sounds and air entry.  Musculoskeletal:        General: Normal range of motion.     Cervical back: Normal range of motion and neck supple.     Comments: Right upper extremity reveals a soft palpable lump along the bicep, more noticeable with  flexion. The area is non-tender with no warmth, erythema, or discoloration. Palpation does not reproduce pain. The patient demonstrates full strength, intact sensation, and complete range of motion in the extremity. Neurovascular status is intact.  Skin:    General: Skin is warm and dry.  Neurological:  General: No focal deficit present.     Mental Status: He is alert and oriented to person, place, and time.  Psychiatric:        Speech: Speech normal.        Behavior: Behavior is cooperative.      UC Treatments / Results  Labs (all labs ordered are listed, but only abnormal results are displayed) Labs Reviewed - No data to display  EKG   Radiology No results found.  Procedures Procedures (including critical care time)  Medications Ordered in UC Medications - No data to display  Initial Impression / Assessment and Plan / UC Course  I have reviewed the triage vital signs and the nursing notes.  Pertinent labs & imaging results that were available during my care of the patient were reviewed by me and considered in my medical decision making (see chart for details).     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 He reports development of a palpable lump in the bicep that is more prominent with flexion but non-tender. Pain is only present with heavy lifting and is localized to the tendon area below the bicep. There is no weakness, numbness, tingling, bruising, or discoloration. Clinical findings are more consistent with a mild strain or possible partial tear rather than complete rupture, though definitive diagnosis may require orthopedic evaluation and MRI. The patient was advised to use an ace wrap during the day, apply ice several times daily, and take naproxen twice daily with food. Patient should avoid heavy lifting, pulling, or overhead activities with the affected arm. Light duty is permitted as tolerated,  provided it does not aggravate symptoms. Restrictions are recommended until re-evaluation or clearance by orthopedics if symptoms persist.Follow-up with orthopedics is recommended if there is no improvement within two weeks for further evaluation or possible physical therapy. Emergency care was advised if he develops worsening pain, weakness, or changes in arm color or sensation.  Today's evaluation has revealed no signs of a dangerous process. Discussed diagnosis with patient and/or guardian. Patient and/or guardian aware of their diagnosis, possible red flag symptoms to watch out for and need for close follow up. Patient and/or guardian understands verbal and written discharge instructions. Patient and/or guardian comfortable with plan and disposition.  Patient and/or guardian has a clear mental status at this time, good insight into illness (after discussion and teaching) and has clear judgment to make decisions regarding their care  Documentation was completed with the aid of voice recognition software. Transcription may contain typographical errors.    Final Clinical Impressions(s) / UC Diagnoses   Final diagnoses:  Injury of tendon of biceps     Discharge Instructions      Usted fue atendido hoy por una lesin en el bceps derecho que ocurri hace unas semanas mientras realizaba trabajo de construccin pesada. El examen sugiere que lo ms probable es que se trate de una distensin muscular o posiblemente un pequeo desgarro parcial, en lugar de un desgarro completo. En este momento, la lesin no [place] signos de [place] grave, pero es importante cuidar el brazo y Automotive engineer actividades que puedan [place].  Para ayudar con la recuperacin, debe usar un vendaje elstico (ace wrap) durante el da para dar soporte y retirarlo por la noche y para baarse. Aplique hielo en el rea durante unos 20 minutos, dos a tres veces al da, asegurndose de Scientific laboratory technician una toalla entre el hielo y la piel para  Company secretary.  Debe tomar el naproxeno recetado dos veces al da con alimentos, por ejemplo con el desayuno y la cena, para reducir Chief Technology Officer y la inflamacin. Mientras est tomando [employer], no use antiinflamatorios de venta libre como aspirina, Motrin , ibuprofeno o Aleve, ya que esto puede aumentar el riesgo de efectos secundarios. Si es necesario, puede tomar Tylenol (acetaminofn) 1000 mg cada seis horas para un alivio adicional del Engineer, mining. Esto equivale a dos tabletas de 500 mg a la vez. Tenga cuidado de no tomar ms de 4000 mg de Tylenol en un perodo de 24 horas.  Es importante Medical illustrator objetos pesados, jalar o hacer trabajo por encima de la cabeza con este brazo hasta que reciba autorizacin. El trabajo liviano est permitido si no aumenta su dolor ni hace que el bulto sea ms notorio.  Por favor, programe una cita de seguimiento con un especialista en ortopedia si sus sntomas no mejoran dentro de las [employer]. Se ha proporcionado la informacin para el especialista en ortopedia. Ellos pueden recomendar estudios especiales como una resonancia magntica (MRI) o fisioterapia si la lesin no est sanando como se espera.  Busque atencin de emergencia de inmediato si presenta dolor repentino que empeora, debilidad notable en el brazo, entumecimiento u hormigueo, o si el brazo cambia de color, se pone fro o se hincha. Estos podran ser signos de un problema ms grave.  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 The exam suggests this is most likely a muscle strain or possibly a small partial tear, rather than a complete tear. At this time, the injury does not show signs of severe damage, but it is important to take care of the arm and avoid activities that could make it worse. To help with healing, you should wear an ace wrap during the day to provide support and remove it at night and for  showers. Apply ice to the area for about 20 minutes, two to three times a day, making sure to place a towel between the ice and your skin to prevent injury. You should take the prescribed naproxen twice daily with food, such as with breakfast and dinner, to reduce pain and inflammation. While taking this medication, do not use over-the-counter anti-inflammatories such as aspirin, Motrin , ibuprofen , or Aleve, as this may increase the risk of side effects. If needed, you may take Tylenol (acetaminophen) 1000 mg every six hours for additional pain relief. This equals two 500 mg tablets at a time. Be careful not to take more than 4000 mg of Tylenol in a 24-hour period. It is important to avoid heavy lifting, pulling, or overhead work with this arm until cleared. Light duty work is allowed if it does not increase your pain or make the lump more noticeable. Please schedule a follow-up with an orthopedic specialist if your symptoms do not improve within the next two weeks. Information to the orthopedic specialist has been provided. They may recommend special testing such as an MRI or physical therapy if the injury is not healing as expected. Seek emergency care right away if you develop sudden worsening pain, noticeable weakness in the arm, numbness or tingling, or if the arm changes color, becomes cold, or looks swollen. These could be signs of a more serious problem.     ED Prescriptions     Medication Sig Dispense Auth. Provider   naproxen (NAPROSYN) 500 MG tablet Take 1 tablet (500 mg  total) by mouth 2 (two) times daily with a meal. Take with food to avoid stomach upset. Do not take any additional NSAIDs while on this. You may take tylenol in addition to this if needed for extra pain relief. 20 tablet Iola Lukes, FNP      PDMP not reviewed this encounter.   Iola Lukes, [place] 10/27/23 1331
